# Patient Record
Sex: Male | Born: 1975 | Race: White | Hispanic: No | Marital: Single | Smoking: Current every day smoker
Health system: Southern US, Community
[De-identification: ages and names within clinical notes are randomized; demographics above are authoritative.]

## PROBLEM LIST (undated history)

## (undated) DIAGNOSIS — F32A Depression, unspecified: Secondary | ICD-10-CM

## (undated) DIAGNOSIS — F1910 Other psychoactive substance abuse, uncomplicated: Secondary | ICD-10-CM

## (undated) DIAGNOSIS — F329 Major depressive disorder, single episode, unspecified: Secondary | ICD-10-CM

## (undated) DIAGNOSIS — K219 Gastro-esophageal reflux disease without esophagitis: Secondary | ICD-10-CM

---

## 2006-11-16 ENCOUNTER — Emergency Department: Payer: Self-pay | Admitting: Unknown Physician Specialty

## 2007-04-02 ENCOUNTER — Emergency Department: Payer: Self-pay | Admitting: General Practice

## 2007-08-09 ENCOUNTER — Emergency Department: Payer: Self-pay | Admitting: Emergency Medicine

## 2008-03-21 ENCOUNTER — Emergency Department: Payer: Self-pay | Admitting: Emergency Medicine

## 2008-07-18 ENCOUNTER — Emergency Department: Payer: Self-pay | Admitting: Emergency Medicine

## 2009-08-28 ENCOUNTER — Emergency Department: Payer: Self-pay | Admitting: Internal Medicine

## 2009-09-08 ENCOUNTER — Emergency Department: Payer: Self-pay | Admitting: Emergency Medicine

## 2009-09-26 ENCOUNTER — Emergency Department: Payer: Self-pay | Admitting: Emergency Medicine

## 2010-04-25 ENCOUNTER — Emergency Department: Payer: Self-pay | Admitting: Emergency Medicine

## 2010-06-30 ENCOUNTER — Emergency Department (HOSPITAL_COMMUNITY): Admission: EM | Admit: 2010-06-30 | Discharge: 2010-06-30 | Payer: Self-pay | Admitting: Emergency Medicine

## 2011-03-05 ENCOUNTER — Emergency Department: Payer: Self-pay | Admitting: Emergency Medicine

## 2011-03-15 ENCOUNTER — Inpatient Hospital Stay: Payer: Self-pay | Admitting: Internal Medicine

## 2011-03-16 ENCOUNTER — Inpatient Hospital Stay: Payer: Self-pay | Admitting: Psychiatry

## 2011-04-16 ENCOUNTER — Emergency Department: Payer: Self-pay | Admitting: Emergency Medicine

## 2011-06-22 ENCOUNTER — Inpatient Hospital Stay: Payer: Self-pay | Admitting: Psychiatry

## 2011-08-04 ENCOUNTER — Emergency Department: Payer: Self-pay | Admitting: Emergency Medicine

## 2011-08-07 IMAGING — CR DG RIBS 2V*L*
1 series · 3 of 3 positions shown · non-contrast
Comparison: none

REASON FOR EXAM: pain
COMMENTS:

PROCEDURE:     DXR - DXR RIBS LEFT UNILATERAL  - April 25, 2010 [DATE]
RESULT:     Images of the left ribs demonstrate a marker in the
costochondral region inferiorly on the left. There is no evidence of
fracture, dislocation or radiopaque foreign body otherwise.

[Series 1: view not recorded · 0.17mm/px · 3 of 3 slices shown]
[im 1/3]
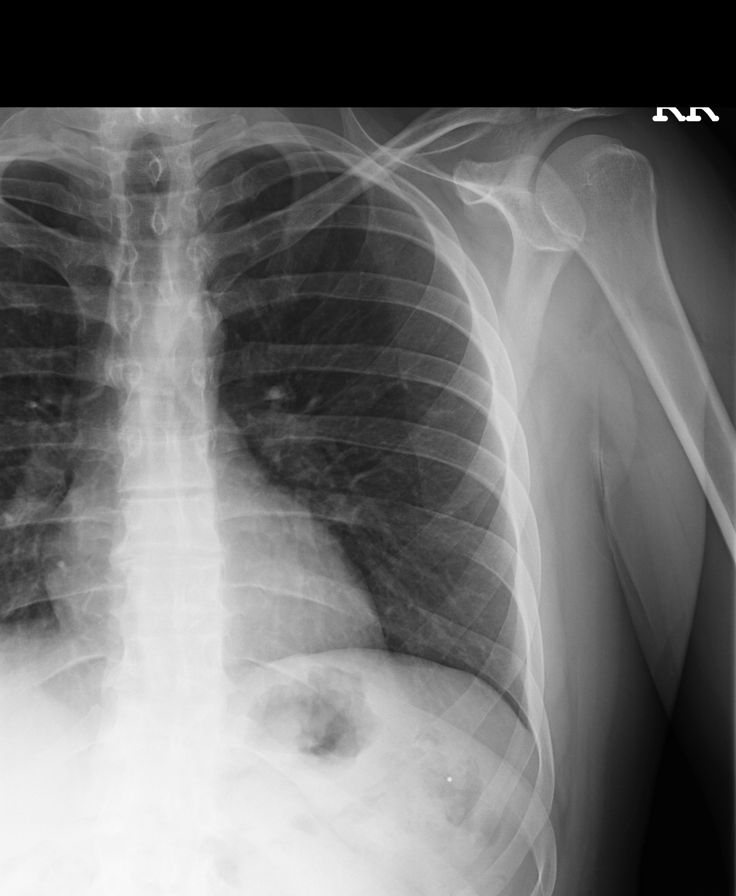
[im 2/3]
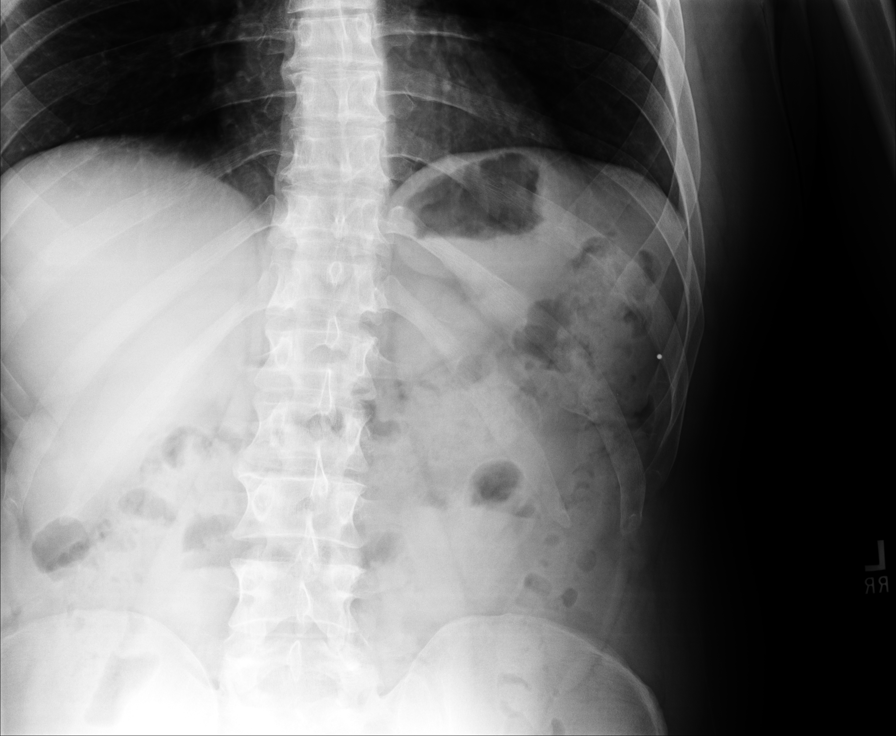
[im 3/3]
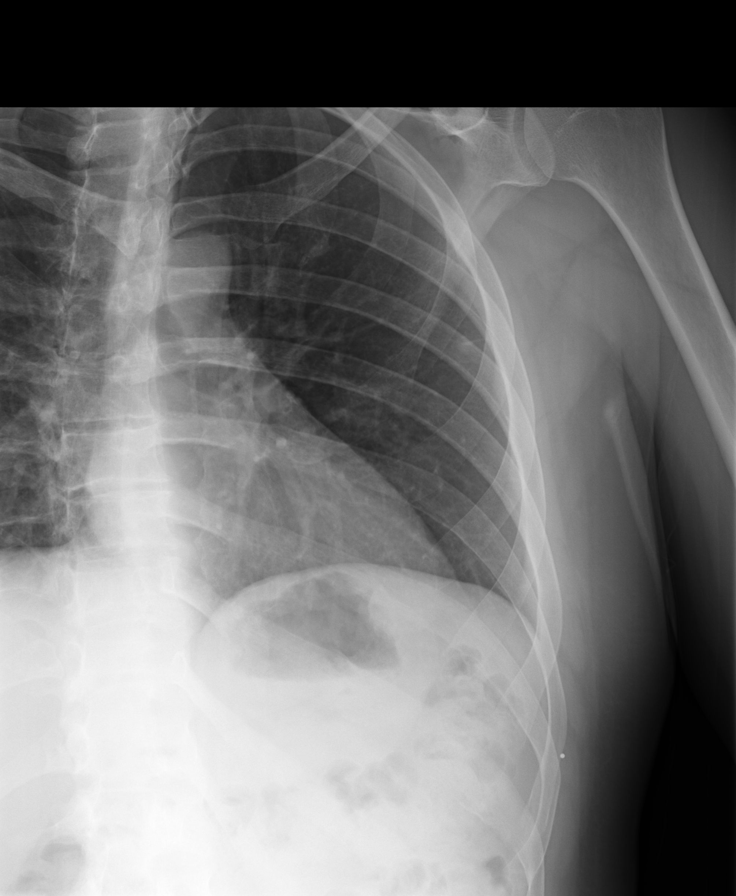

[3 of 3 positions shown; findings below may reference images not displayed]

IMPRESSION: No left rib fracture evident.

## 2011-08-07 IMAGING — CT CT CERVICAL SPINE WITHOUT CONTRAST
3 series · 16 of 33 positions shown, 19 images · non-contrast
Comparison: none

RESULT:      Multislice helical acquisition through the cervical spine is
reconstructed at bone window settings in the axial, coronal and sagittal
planes at 2 mm slice thickness and compared images from the CT dated
03/21/2008.

The previous fractures have healed. The alignment is maintained. There is no
compression deformity or subluxation. No bony encroachment on the spinal
canal is evident.

[Series 4: coronal · coronal · 0.29mm/px · 3 of 53 slices shown]
[im 11/53  bone]
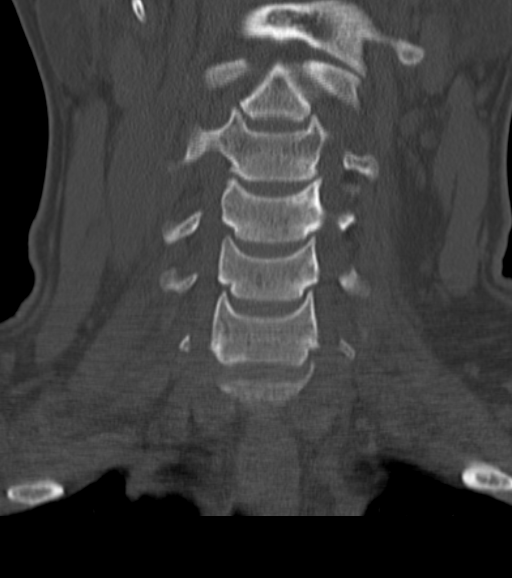
[im 21/53  bone]
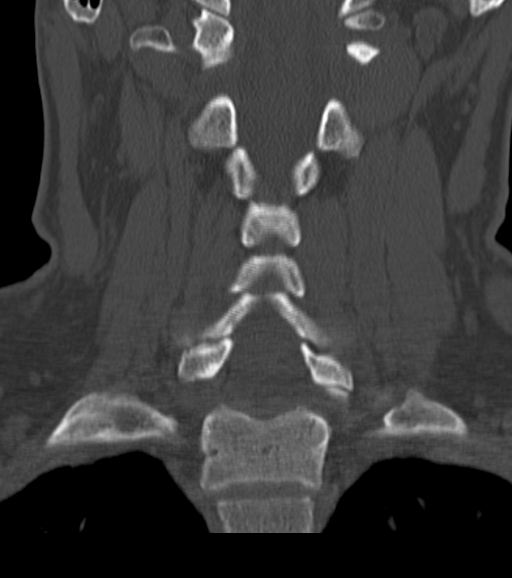
[im 32/53  bone]
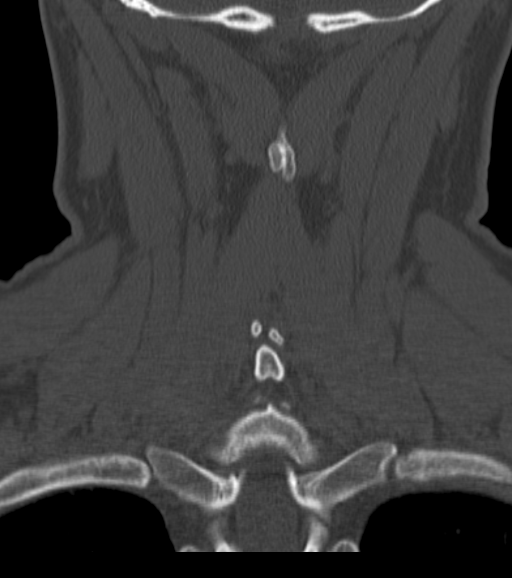

[Series 5: sagittal · sagittal · 0.32mm/px · 5 of 48 slices shown, 6 images]
[im 16/48  bone]
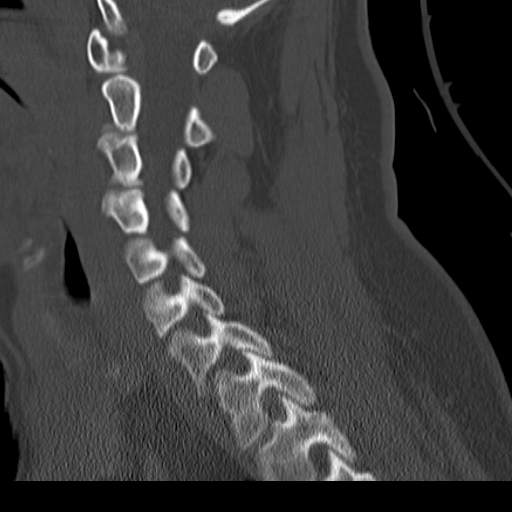
[im 20/48  bone]
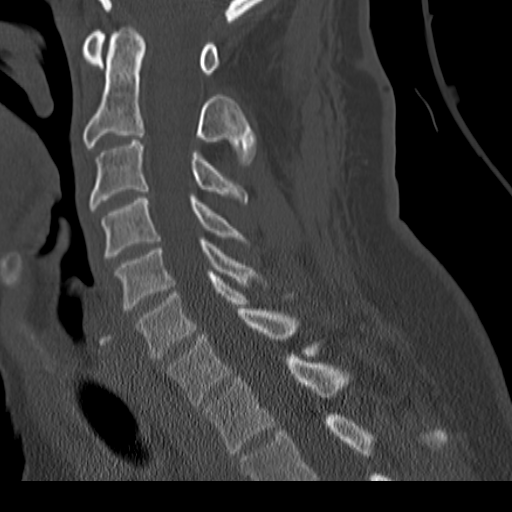
[im 24/48  soft-tissue]
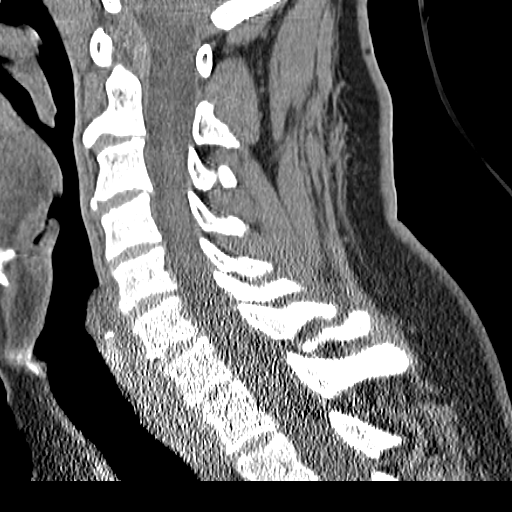
[im 24/48  bone]
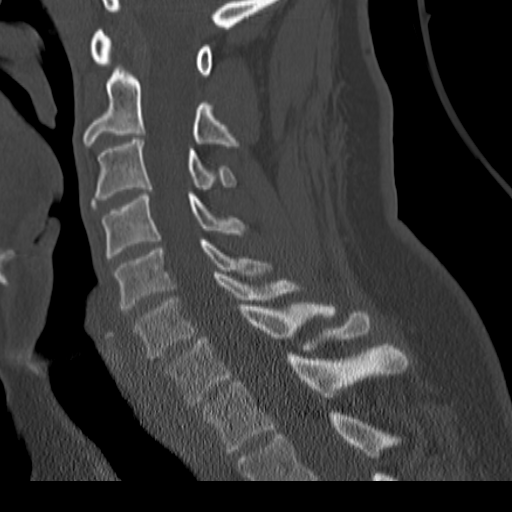
[im 28/48  bone]
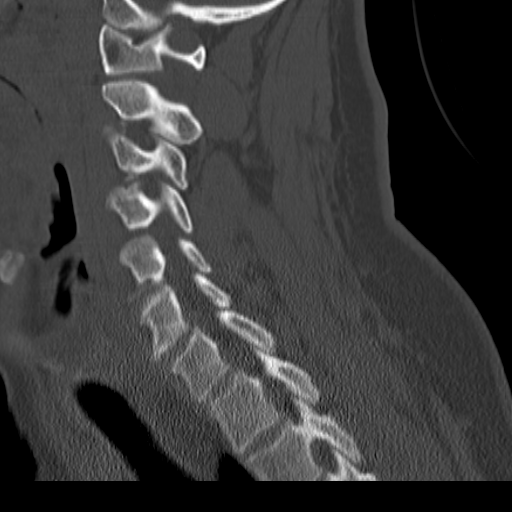
[im 32/48  bone]
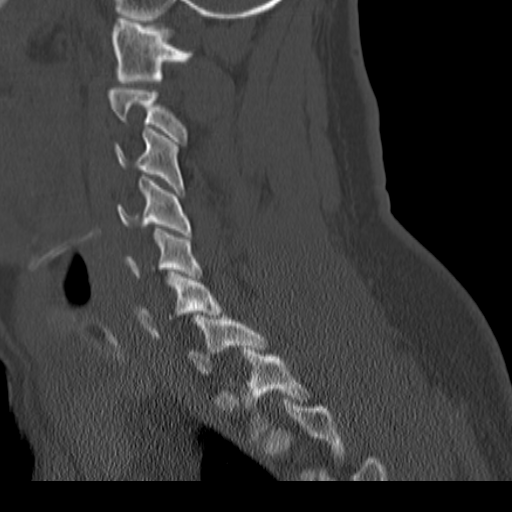

[Series 6: axial · axial · 0.33mm/px · z∈[+740,+872]mm · 8 of 78 slices shown, 10 images]
[im 6/78  soft-tissue]
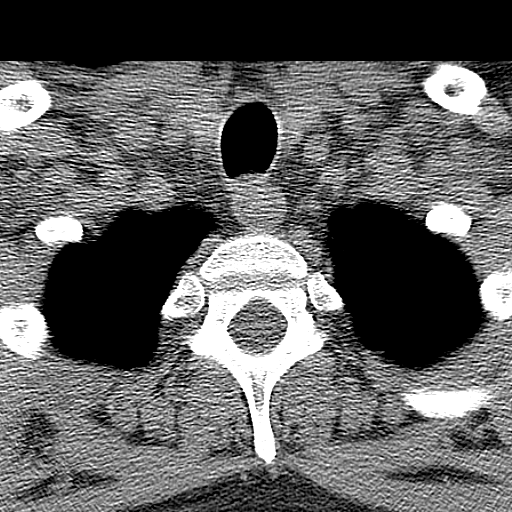
[im 6/78  bone]
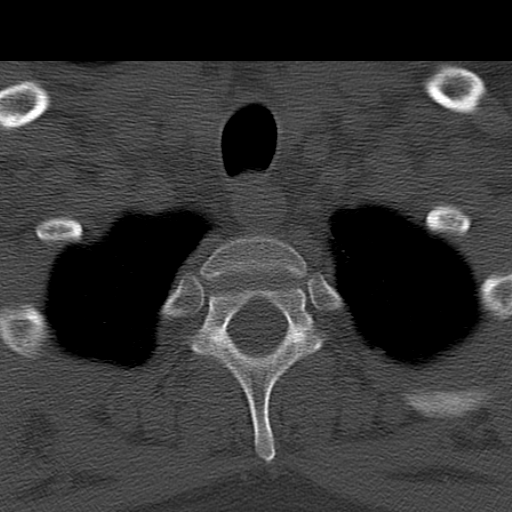
[im 18/78  bone]
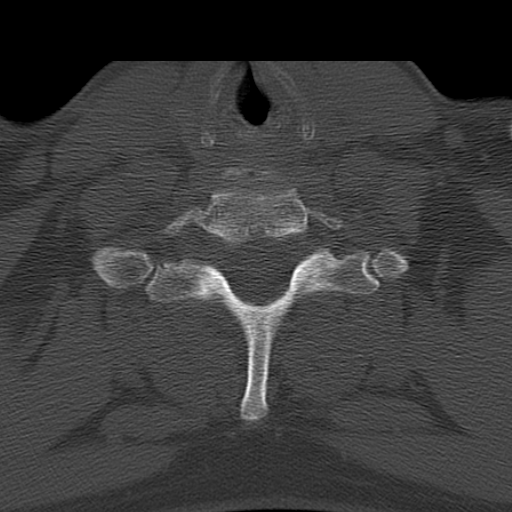
[im 24/78  bone]
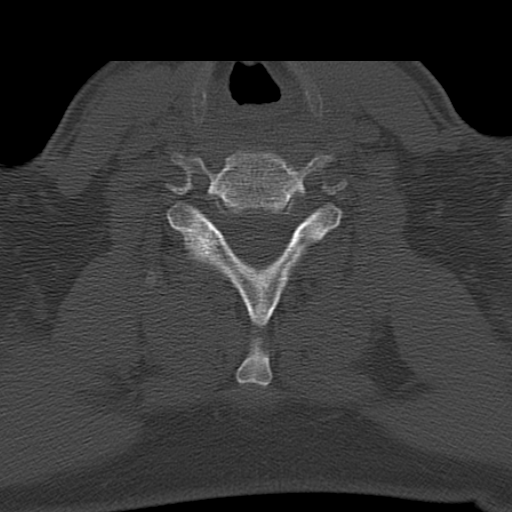
[im 36/78  bone]
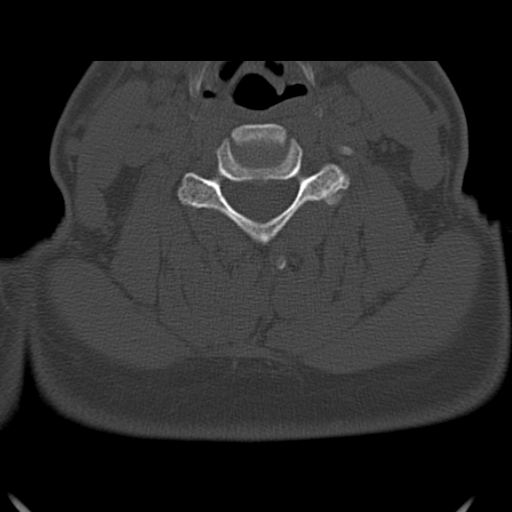
[im 42/78  soft-tissue]
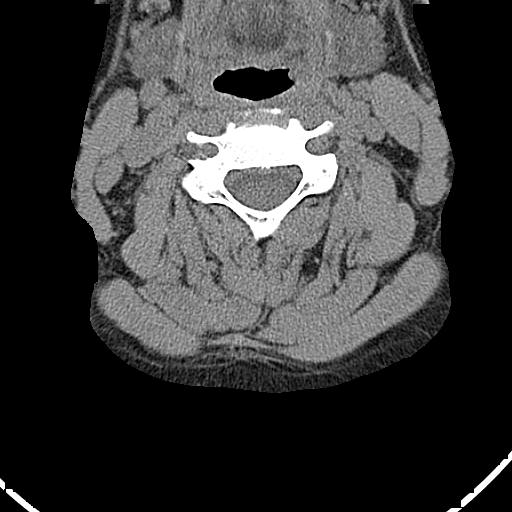
[im 42/78  bone]
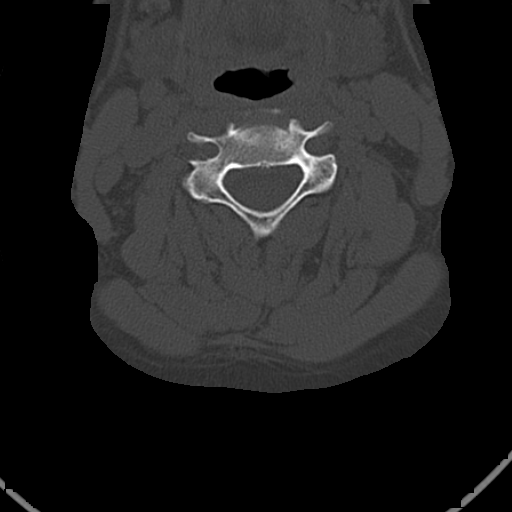
[im 54/78  bone]
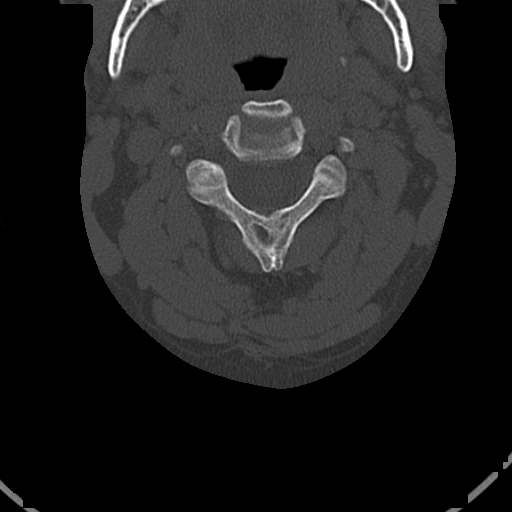
[im 60/78  bone]
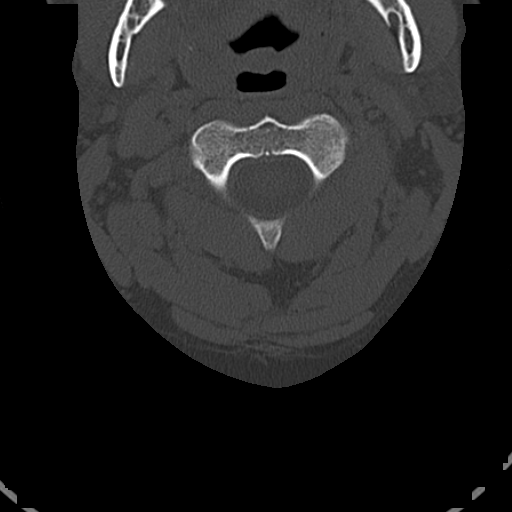
[im 72/78  bone]
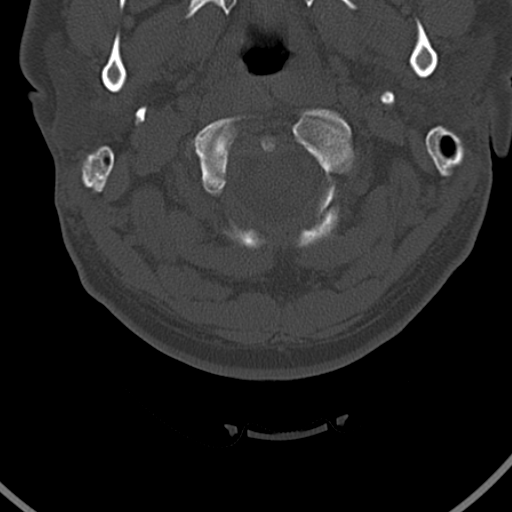

[16 of 33 positions shown; findings below may reference images not displayed]

IMPRESSION: No acute bony abnormality. Please see above.

## 2011-09-25 ENCOUNTER — Emergency Department: Payer: Self-pay | Admitting: Emergency Medicine

## 2013-09-27 ENCOUNTER — Emergency Department: Payer: Self-pay | Admitting: Emergency Medicine

## 2015-03-07 ENCOUNTER — Emergency Department
Admit: 2015-03-07 | Disposition: A | Discharge status: Home / Self Care | Payer: Self-pay | Admitting: Emergency Medicine

## 2015-08-14 ENCOUNTER — Emergency Department: Payer: Self-pay

## 2015-08-14 ENCOUNTER — Encounter: Payer: Self-pay | Admitting: Emergency Medicine

## 2015-08-14 ENCOUNTER — Emergency Department
Admission: EM | Admit: 2015-08-14 | Discharge: 2015-08-14 | Disposition: A | Discharge status: Home / Self Care | Payer: Self-pay | Attending: Emergency Medicine | Admitting: Emergency Medicine

## 2015-08-14 DIAGNOSIS — K2100 Gastro-esophageal reflux disease with esophagitis, without bleeding: Secondary | ICD-10-CM

## 2015-08-14 DIAGNOSIS — Z720 Tobacco use: Secondary | ICD-10-CM | POA: Insufficient documentation

## 2015-08-14 HISTORY — DX: Gastro-esophageal reflux disease without esophagitis: K21.9

## 2015-08-14 HISTORY — DX: Major depressive disorder, single episode, unspecified: F32.9

## 2015-08-14 HISTORY — DX: Depression, unspecified: F32.A

## 2015-08-14 LAB — BASIC METABOLIC PANEL
Anion gap: 6 (ref 5–15)
BUN: 13 mg/dL (ref 6–20)
CHLORIDE: 102 mmol/L (ref 101–111)
CO2: 27 mmol/L (ref 22–32)
CREATININE: 0.96 mg/dL (ref 0.61–1.24)
Calcium: 8.9 mg/dL (ref 8.9–10.3)
GFR calc Af Amer: >60 mL/min (ref >60)
GFR calc non Af Amer: >60 mL/min (ref >60)
GLUCOSE: 98 mg/dL (ref 65–99)
POTASSIUM: 4.4 mmol/L (ref 3.5–5.1)
Sodium: 135 mmol/L (ref 135–145)

## 2015-08-14 LAB — CBC
HEMATOCRIT: 51.3 % (ref 40.0–52.0)
Hemoglobin: 17.5 g/dL (ref 13.0–18.0)
MCH: 30.7 pg (ref 26.0–34.0)
MCHC: 34.1 g/dL (ref 32.0–36.0)
MCV: 90.1 fL (ref 80.0–100.0)
Platelets: 227 10*3/uL (ref 150–440)
RBC: 5.69 MIL/uL (ref 4.40–5.90)
RDW: 13.9 % (ref 11.5–14.5)
WBC: 5.6 10*3/uL (ref 3.8–10.6)

## 2015-08-14 LAB — TROPONIN I: Troponin I: <0.03 ng/mL (ref <0.031)

## 2015-08-14 MED ORDER — SUCRALFATE 1 G PO TABS: 1.0000 g | ORAL_TABLET | Freq: Four times a day (QID) | ORAL | Status: DC

## 2015-08-14 MED ORDER — RANITIDINE HCL 150 MG PO CAPS: 150.0000 mg | ORAL_CAPSULE | Freq: Two times a day (BID) | ORAL | Status: DC

## 2015-08-14 NOTE — Discharge Instructions (Signed)
Gastroesophageal Reflux Disease, Adult Gastroesophageal reflux disease (GERD) happens when acid from your stomach flows up into the esophagus. When acid comes in contact with the esophagus, the acid causes soreness (inflammation) in the esophagus. Over time, GERD may create small holes (ulcers) in the lining of the esophagus. CAUSES   Increased body weight. This puts pressure on the stomach, making acid rise from the stomach into the esophagus.  Smoking. This increases acid production in the stomach.  Drinking alcohol. This causes decreased pressure in the lower esophageal sphincter (valve or ring of muscle between the esophagus and stomach), allowing acid from the stomach into the esophagus.  Late evening meals and a full stomach. This increases pressure and acid production in the stomach.  A malformed lower esophageal sphincter. Sometimes, no cause is found. SYMPTOMS   Burning pain in the lower part of the mid-chest behind the breastbone and in the mid-stomach area. This may occur twice a week or more often.  Trouble swallowing.  Sore throat.  Dry cough.  Asthma-like symptoms including chest tightness, shortness of breath, or wheezing. DIAGNOSIS  Your caregiver may be able to diagnose GERD based on your symptoms. In some cases, X-rays and other tests may be done to check for complications or to check the condition of your stomach and esophagus. TREATMENT  Your caregiver may recommend over-the-counter or prescription medicines to help decrease acid production. Ask your caregiver before starting or adding any new medicines.  HOME CARE INSTRUCTIONS   Change the factors that you can control. Ask your caregiver for guidance concerning weight loss, quitting smoking, and alcohol consumption.  Avoid foods and drinks that make your symptoms worse, such as:  Caffeine or alcoholic drinks.  Chocolate.  Peppermint or mint flavorings.  Garlic and onions.  Spicy foods.  Citrus fruits,  such as oranges, lemons, or limes.  Tomato-based foods such as sauce, chili, salsa, and pizza.  Fried and fatty foods.  Avoid lying down for the 3 hours prior to your bedtime or prior to taking a nap.  Eat small, frequent meals instead of large meals.  Wear loose-fitting clothing. Do not wear anything tight around your waist that causes pressure on your stomach.  Raise the head of your bed 6 to 8 inches with wood blocks to help you sleep. Extra pillows will not help.  Only take over-the-counter or prescription medicines for pain, discomfort, or fever as directed by your caregiver.  Do not take aspirin, ibuprofen, or other nonsteroidal anti-inflammatory drugs (NSAIDs). SEEK IMMEDIATE MEDICAL CARE IF:   You have pain in your arms, neck, jaw, teeth, or back.  Your pain increases or changes in intensity or duration.  You develop nausea, vomiting, or sweating (diaphoresis).  You develop shortness of breath, or you faint.  Your vomit is green, yellow, black, or looks like coffee grounds or blood.  Your stool is red, bloody, or black. These symptoms could be signs of other problems, such as heart disease, gastric bleeding, or esophageal bleeding. MAKE SURE YOU:   Understand these instructions.  Will watch your condition.  Will get help right away if you are not doing well or get worse. Document Released: 08/25/2005 Document Revised: 02/07/2012 Document Reviewed: 06/04/2011 [employer] Patient Information 2015 [employer]. This information is not intended to replace advice given to you by your health care provider. Make sure you discuss any questions you have with your health care provider.  

## 2015-08-14 NOTE — ED Notes (Signed)
Pt to ed with c/o central chest pain that started about 3 months ago.  Pt denies seeing PMD for same.  Pt also reports last 3 days increasingly more severe.  Pt reports cramping and tightness in chest with nausea.

## 2015-08-14 NOTE — ED Notes (Signed)
Pt states upper abd spasms for a few weeks, states worse when lying down at night and worse with eating, states he takes omperzole 40 mg daily, denies any nausea or vomiting

## 2015-08-14 NOTE — ED Provider Notes (Signed)
[facility] Emergency Department Provider Note  ____________________________________________  Time seen: 1:55 PM  I have reviewed the triage vital signs and the nursing notes.   HISTORY  Chief Complaint Chest Pain    HPI William Gonzales is a 39 y.o. male who complains of upper abdominal pain radiating into the chest that is a burning and tightness associated with nausea. This been going on for the last 3 months ago increasingly more severe in the last 3 days. Not exertional, not pleuritic. No associated vomiting shortness of breath or diaphoresis. He does have a 20 year smoking history and does continue to smoke. Denies any coughing or fevers.  Takes omeprazole daily for GERD. Has an appointment with his primary care doctor, Dr. Elbert Ewings in one week.   Past Medical History  Diagnosis Date  . GERD (gastroesophageal reflux disease)   . Depression      There are no active problems to display for this patient.    History reviewed. No pertinent past surgical history.   Current Outpatient Rx  Name  Route  Sig  Dispense  Refill  . ranitidine (ZANTAC) 150 MG capsule   Oral   Take 1 capsule (150 mg total) by mouth 2 (two) times daily.   28 capsule   0   . sucralfate (CARAFATE) 1 G tablet   Oral   Take 1 tablet (1 g total) by mouth 4 (four) times daily.   120 tablet   1      Allergies Review of patient's allergies indicates no known allergies.   History reviewed. No pertinent family history.  Social History Social History  Substance Use Topics  . Smoking status: Current Every Day Smoker  . Smokeless tobacco: None  . Alcohol Use: Yes    Review of Systems  Constitutional:   No fever or chills. No weight changes Eyes:   No blurry vision or double vision.  ENT:   No sore throat. Cardiovascular:   Positive as above chest pain and upper abdominal pain. Respiratory:   No dyspnea or cough. Gastrointestinal:   Negative for abdominal pain, vomiting  and diarrhea.  No BRBPR or melena. Genitourinary:   Negative for dysuria, urinary retention, bloody urine, or difficulty urinating. Musculoskeletal:   Negative for back pain. No joint swelling or pain. Skin:   Negative for rash. Neurological:   Negative for headaches, focal weakness or numbness. Psychiatric:  No anxiety or depression.   Endocrine:  No hot/cold intolerance, changes in energy, or sleep difficulty.  10-point ROS otherwise negative.  ____________________________________________   PHYSICAL EXAM:  VITAL SIGNS: ED Triage Vitals  Enc Vitals Group     BP 08/14/15 1204 138/86 mmHg     Pulse Rate 08/14/15 1204 79     Resp 08/14/15 1204 20     Temp 08/14/15 1204 98.2 F (36.8 C)     Temp Source 08/14/15 1204 Oral     SpO2 08/14/15 1204 100 %     Weight 08/14/15 1204 190 lb (86.183 kg)     Height 08/14/15 1204 5\' 8"  (1.727 m)     Head Cir --      Peak Flow --      Pain Score 08/14/15 1205 6     Pain Loc --      Pain Edu? --      Excl. in GC? --      Constitutional:   Alert and oriented. Well appearing and in no distress. Eyes:   No scleral  icterus. No conjunctival pallor. PERRL. EOMI ENT   Head:   Normocephalic and atraumatic.   Nose:   No congestion/rhinnorhea. No septal hematoma   Mouth/Throat:   MMM, no pharyngeal erythema. No peritonsillar mass. No uvula shift.   Neck:   No stridor. No SubQ emphysema. No meningismus. Hematological/Lymphatic/Immunilogical:   No cervical lymphadenopathy. Cardiovascular:   RRR. Normal and symmetric distal pulses are present in all extremities. No murmurs, rubs, or gallops. Respiratory:   Normal respiratory effort without tachypnea nor retractions. Breath sounds are clear and equal bilaterally. No wheezes/rales/rhonchi. Gastrointestinal:   Soft with mild epigastric tenderness. No distention. There is no CVA tenderness.  No rebound, rigidity, or guarding. Genitourinary:   deferred Musculoskeletal:   Nontender with  normal range of motion in all extremities. No joint effusions.  No lower extremity tenderness.  No edema. Neurologic:   Normal speech and language.  CN 2-10 normal. Motor grossly intact. No pronator drift.  Normal gait. No gross focal neurologic deficits are appreciated.  Skin:    Skin is warm, dry and intact. No rash noted.  No petechiae, purpura, or bullae. Psychiatric:   Mood and affect are normal. Speech and behavior are normal. Patient exhibits appropriate insight and judgment.  ____________________________________________    LABS (pertinent positives/negatives) (all labs ordered are listed, but only abnormal results are displayed) Labs Reviewed  BASIC METABOLIC PANEL  CBC  TROPONIN I   ____________________________________________   EKG  Interpreted by me Normal sinus rhythm rate of 80, normal axis intervals QRS and ST segments and T waves. There is one PAC on the strip.  ____________________________________________    RADIOLOGY  Chest x-ray unremarkable  ____________________________________________   PROCEDURES   ____________________________________________   INITIAL IMPRESSION / ASSESSMENT AND PLAN / ED COURSE  Pertinent labs & imaging results that were available during my care of the patient were reviewed by me and considered in my medical decision making (see chart for details).  Labs EKG and chest x-ray unremarkable. History not concerning for ACS PE TAD Pneumovax carditis many sinus pneumonia or sepsis. It is consistent with his history of GERD especially with ongoing smoking. I will put him on Sudafed and Zantac for 1 week trial have him follow up with primary care who can discuss referral to gastroenterology if needed. Low suspicion for AAA biliary disease or pancreatitis.     ____________________________________________   FINAL CLINICAL IMPRESSION(S) / ED DIAGNOSES  Final diagnoses:  Gastroesophageal reflux disease with esophagitis       Sharman Cheek, MD 08/14/15 1445

## 2016-08-07 ENCOUNTER — Encounter (HOSPITAL_COMMUNITY): Payer: Self-pay | Admitting: Emergency Medicine

## 2016-08-07 ENCOUNTER — Emergency Department (HOSPITAL_COMMUNITY)
Admission: EM | Admit: 2016-08-07 | Discharge: 2016-08-07 | Disposition: A | Discharge status: Home / Self Care | Payer: Self-pay | Attending: Emergency Medicine | Admitting: Emergency Medicine

## 2016-08-07 DIAGNOSIS — T40601A Poisoning by unspecified narcotics, accidental (unintentional), initial encounter: Secondary | ICD-10-CM | POA: Insufficient documentation

## 2016-08-07 DIAGNOSIS — F17200 Nicotine dependence, unspecified, uncomplicated: Secondary | ICD-10-CM | POA: Insufficient documentation

## 2016-08-07 DIAGNOSIS — Z79899 Other long term (current) drug therapy: Secondary | ICD-10-CM | POA: Insufficient documentation

## 2016-08-07 MED ORDER — NICOTINE 21 MG/24HR TD PT24
21.0000 mg | MEDICATED_PATCH | Freq: Every day | TRANSDERMAL | Status: DC
  Administered 2016-08-07: 21 mg via TRANSDERMAL
  Filled 2016-08-07: qty 1

## 2016-08-07 NOTE — ED Triage Notes (Signed)
Per EMS, pt opiate overdose, given 4mg  Narcan. Pt reports snorting heroin. Pt also reports chronic knee and neck pain.

## 2016-08-07 NOTE — ED Notes (Signed)
Bed: [id number] Expected date:  Expected time:  Means of arrival:  Comments: OD

## 2016-08-07 NOTE — ED Notes (Signed)
MD at bedside. 

## 2016-08-07 NOTE — ED Provider Notes (Signed)
[facility]-EMERGENCY DEPT Provider Note   CSN: [id number] Arrival date & time: 08/07/16  1703     History   Chief Complaint Chief Complaint  Patient presents with  . Drug Overdose    HPI William PrimeRobert D Lipe is a 40 y.o. male.  He presents by EMS, with history that he required Narcan to arouse. He does not remember what happened today. He complains of pain in center of his chest and nasal congestion. Is not clear if the patient received CPR or not. He states that he has been snorting heroin. He complains of chronic pain in neck, back, arms and legs. He denies fever, chills, nausea or vomiting. There are no other known modifying factors.  HPI  Past Medical History:  Diagnosis Date  . Depression   . GERD (gastroesophageal reflux disease)     There are no active problems to display for this patient.   History reviewed. No pertinent surgical history.     Home Medications    Prior to Admission medications   Medication Sig Start Date End Date Taking? Authorizing Provider  ranitidine (ZANTAC) 150 MG capsule Take 1 capsule (150 mg total) by mouth 2 (two) times daily. 08/14/15   Sharman CheekPhillip Stafford, MD  sucralfate (CARAFATE) 1 G tablet Take 1 tablet (1 g total) by mouth 4 (four) times daily. 08/14/15   Sharman CheekPhillip Stafford, MD    Family History History reviewed. No pertinent family history.  Social History Social History  Substance Use Topics  . Smoking status: Current Every Day Smoker  . Smokeless tobacco: Current User  . Alcohol use Yes     Allergies   Review of patient's allergies indicates no known allergies.   Review of Systems Review of Systems  All other systems reviewed and are negative.    Physical Exam Updated Vital Signs BP 147/94 (BP Location: Right Arm)   Pulse 97   Temp 98.9 F (37.2 C) (Oral)   Resp 16   SpO2 96%   Physical Exam  Constitutional: He is oriented to person, place, and time. He appears well-developed and well-nourished.  HENT:  Head:  Normocephalic and atraumatic.  Right Ear: External ear normal.  Left Ear: External ear normal.  Nasal congestion, consistent with recent administration of Narcan  Eyes: Conjunctivae and EOM are normal. Pupils are equal, round, and reactive to light.  Neck: Normal range of motion and phonation normal. Neck supple.  Cardiovascular: Normal rate.   Pulmonary/Chest: Effort normal. He exhibits no bony tenderness.  Musculoskeletal: Normal range of motion.  Neurological: He is alert and oriented to person, place, and time. No cranial nerve deficit or sensory deficit. He exhibits normal muscle tone. Coordination normal.  Skin: Skin is warm, dry and intact.  Psychiatric: He has a normal mood and affect. His behavior is normal. Judgment and thought content normal.  Nursing note and vitals reviewed.    ED Treatments / Results  Labs (all labs ordered are listed, but only abnormal results are displayed) Labs Reviewed - No data to display  EKG  EKG Interpretation None       Radiology No results found.  Procedures Procedures (including critical care time)  Medications Ordered in ED Medications  nicotine (NICODERM CQ - dosed in mg/24 hours) patch 21 mg (21 mg Transdermal Patch Applied 08/07/16 1737)     Initial Impression / Assessment and Plan / ED Course  I have reviewed the triage vital signs and the nursing notes.  Pertinent labs & imaging results that were available during  my care of the patient were reviewed by me and considered in my medical decision making (see chart for details).  Clinical Course    Medications  nicotine (NICODERM CQ - dosed in mg/24 hours) patch 21 mg (21 mg Transdermal Patch Applied 08/07/16 1737)    Patient Vitals for the past 24 hrs:  BP Temp Temp src Pulse Resp SpO2  08/07/16 1758 147/94 - - 97 16 96 %  08/07/16 1713 - - - - - 95 %  08/07/16 1711 134/95 98.9 F (37.2 C) Oral 98 20 98 %    5:55 PM Reevaluation with update and discussion. After  initial assessment and treatment, an updated evaluation reveals He is alert, lucid, and desires date at this time. He has a family member here with him. Findings discussed with patient. Arlen Legendre L    Final Clinical Impressions(s) / ED Diagnoses   Final diagnoses:  Narcotic overdose, accidental or unintentional, initial encounter    Recreational  heroin use, with overdose, treated with Narcan successfully. Chronic pain related to prior orthopedic problems.   Nursing Notes Reviewed/ Care Coordinated Applicable Imaging Reviewed Interpretation of Laboratory Data incorporated into ED treatment  The patient appears reasonably screened and/or stabilized for discharge and I doubt any other medical condition or other [facility] requiring further screening, evaluation, or treatment in the ED at this time prior to discharge.  Plan: Home Medications- continue; Home Treatments- rest; return here if the recommended treatment, does not improve the symptoms; Recommended follow up- PCP prn    New Prescriptions New Prescriptions   No medications on file     Mancel Bale, MD 08/07/16 1801

## 2017-01-26 ENCOUNTER — Emergency Department
Admission: EM | Admit: 2017-01-26 | Discharge: 2017-01-26 | Disposition: A | Discharge status: Other Acute Inpatient Hospital | Payer: Self-pay | Attending: Emergency Medicine | Admitting: Emergency Medicine

## 2017-01-26 ENCOUNTER — Inpatient Hospital Stay
Admission: RE | Admit: 2017-01-26 | Discharge: 2017-01-28 | DRG: 897 | Disposition: A | Discharge status: Home / Self Care | Payer: No Typology Code available for payment source | Source: Ambulatory Visit | Attending: Psychiatry | Admitting: Psychiatry

## 2017-01-26 DIAGNOSIS — K219 Gastro-esophageal reflux disease without esophagitis: Secondary | ICD-10-CM

## 2017-01-26 DIAGNOSIS — F17210 Nicotine dependence, cigarettes, uncomplicated: Secondary | ICD-10-CM | POA: Diagnosis present

## 2017-01-26 DIAGNOSIS — Z5181 Encounter for therapeutic drug level monitoring: Secondary | ICD-10-CM | POA: Insufficient documentation

## 2017-01-26 DIAGNOSIS — F15250 Other stimulant dependence with stimulant-induced psychotic disorder with delusions: Secondary | ICD-10-CM | POA: Diagnosis present

## 2017-01-26 DIAGNOSIS — G4700 Insomnia, unspecified: Secondary | ICD-10-CM | POA: Diagnosis present

## 2017-01-26 DIAGNOSIS — F1120 Opioid dependence, uncomplicated: Secondary | ICD-10-CM

## 2017-01-26 DIAGNOSIS — Z79899 Other long term (current) drug therapy: Secondary | ICD-10-CM

## 2017-01-26 DIAGNOSIS — F333 Major depressive disorder, recurrent, severe with psychotic symptoms: Secondary | ICD-10-CM | POA: Diagnosis present

## 2017-01-26 DIAGNOSIS — F29 Unspecified psychosis not due to a substance or known physiological condition: Secondary | ICD-10-CM | POA: Diagnosis present

## 2017-01-26 DIAGNOSIS — F1520 Other stimulant dependence, uncomplicated: Secondary | ICD-10-CM

## 2017-01-26 DIAGNOSIS — F17200 Nicotine dependence, unspecified, uncomplicated: Secondary | ICD-10-CM | POA: Diagnosis present

## 2017-01-26 DIAGNOSIS — F15950 Other stimulant use, unspecified with stimulant-induced psychotic disorder with delusions: Secondary | ICD-10-CM | POA: Diagnosis present

## 2017-01-26 DIAGNOSIS — F3110 Bipolar disorder, current episode manic without psychotic features, unspecified: Secondary | ICD-10-CM | POA: Insufficient documentation

## 2017-01-26 LAB — COMPREHENSIVE METABOLIC PANEL
ALBUMIN: 4.8 g/dL (ref 3.5–5.0)
ALT: 70 U/L — ABNORMAL HIGH (ref 17–63)
ANION GAP: 12 (ref 5–15)
AST: 44 U/L — AB (ref 15–41)
Alkaline Phosphatase: 67 U/L (ref 38–126)
BUN: 22 mg/dL — AB (ref 6–20)
CHLORIDE: 95 mmol/L — AB (ref 101–111)
CO2: 24 mmol/L (ref 22–32)
Calcium: 9.8 mg/dL (ref 8.9–10.3)
Creatinine, Ser: 1.29 mg/dL — ABNORMAL HIGH (ref 0.61–1.24)
GFR calc Af Amer: >60 mL/min (ref >60)
GLUCOSE: 110 mg/dL — AB (ref 65–99)
POTASSIUM: 3.5 mmol/L (ref 3.5–5.1)
Sodium: 131 mmol/L — ABNORMAL LOW (ref 135–145)
Total Bilirubin: 2.6 mg/dL — ABNORMAL HIGH (ref 0.3–1.2)
Total Protein: 8.1 g/dL (ref 6.5–8.1)

## 2017-01-26 LAB — CBC
HCT: 48.6 % (ref 40.0–52.0)
Hemoglobin: 17 g/dL (ref 13.0–18.0)
MCH: 30.5 pg (ref 26.0–34.0)
MCHC: 35 g/dL (ref 32.0–36.0)
MCV: 87.2 fL (ref 80.0–100.0)
PLATELETS: 244 10*3/uL (ref 150–440)
RBC: 5.58 MIL/uL (ref 4.40–5.90)
RDW: 14 % (ref 11.5–14.5)
WBC: 12.3 10*3/uL — ABNORMAL HIGH (ref 3.8–10.6)

## 2017-01-26 LAB — URINE DRUG SCREEN, QUALITATIVE (ARMC ONLY)
Amphetamines, Ur Screen: POSITIVE — AB
BARBITURATES, UR SCREEN: NOT DETECTED
BENZODIAZEPINE, UR SCRN: NOT DETECTED
Cannabinoid 50 Ng, Ur ~~LOC~~: NOT DETECTED
Cocaine Metabolite,Ur ~~LOC~~: NOT DETECTED
MDMA (Ecstasy)Ur Screen: NOT DETECTED
METHADONE SCREEN, URINE: NOT DETECTED
Opiate, Ur Screen: POSITIVE — AB
Phencyclidine (PCP) Ur S: NOT DETECTED
TRICYCLIC, UR SCREEN: NOT DETECTED

## 2017-01-26 LAB — ETHANOL

## 2017-01-26 MED ORDER — DIAZEPAM 5 MG PO TABS
10.0000 mg | ORAL_TABLET | Freq: Once | ORAL | Status: AC
  Administered 2017-01-26: 10 mg via ORAL
  Filled 2017-01-26: qty 2

## 2017-01-26 MED ORDER — ACETAMINOPHEN 325 MG PO TABS
650.0000 mg | ORAL_TABLET | Freq: Four times a day (QID) | ORAL | Status: DC | PRN
Start: 2017-01-26 — End: 2017-01-28

## 2017-01-26 MED ORDER — RISPERIDONE 1 MG PO TABS
1.0000 mg | ORAL_TABLET | Freq: Two times a day (BID) | ORAL | Status: DC
  Administered 2017-01-26: 1 mg via ORAL
  Filled 2017-01-26: qty 1

## 2017-01-26 MED ORDER — NICOTINE 21 MG/24HR TD PT24
21.0000 mg | MEDICATED_PATCH | Freq: Every day | TRANSDERMAL | Status: DC
  Administered 2017-01-27 – 2017-01-28 (×2): 21 mg via TRANSDERMAL
  Filled 2017-01-26 (×3): qty 1

## 2017-01-26 MED ORDER — NICOTINE 21 MG/24HR TD PT24
21.0000 mg | MEDICATED_PATCH | Freq: Once | TRANSDERMAL | Status: DC
Start: 2017-01-26 — End: 2017-01-26
  Administered 2017-01-26: 21 mg via TRANSDERMAL
  Filled 2017-01-26: qty 1

## 2017-01-26 MED ORDER — ALUM & MAG HYDROXIDE-SIMETH 200-200-20 MG/5ML PO SUSP: 30.0000 mL | ORAL | Status: DC | PRN

## 2017-01-26 MED ORDER — RISPERIDONE 1 MG PO TABS
1.0000 mg | ORAL_TABLET | Freq: Two times a day (BID) | ORAL | Status: DC
  Administered 2017-01-26 – 2017-01-28 (×4): 1 mg via ORAL
  Filled 2017-01-26 (×4): qty 1

## 2017-01-26 MED ORDER — MAGNESIUM HYDROXIDE 400 MG/5ML PO SUSP: 30.0000 mL | Freq: Every day | ORAL | Status: DC | PRN

## 2017-01-26 MED ORDER — PANTOPRAZOLE SODIUM 40 MG PO TBEC
40.0000 mg | DELAYED_RELEASE_TABLET | Freq: Every day | ORAL | Status: DC
Start: 2017-01-27 — End: 2017-01-28
  Administered 2017-01-27 – 2017-01-28 (×2): 40 mg via ORAL
  Filled 2017-01-26 (×2): qty 1

## 2017-01-26 MED ORDER — PANTOPRAZOLE SODIUM 40 MG PO TBEC
40.0000 mg | DELAYED_RELEASE_TABLET | Freq: Every day | ORAL | Status: DC
  Administered 2017-01-26: 40 mg via ORAL
  Filled 2017-01-26: qty 1

## 2017-01-26 MED ORDER — TRAZODONE HCL 100 MG PO TABS
100.0000 mg | ORAL_TABLET | Freq: Every evening | ORAL | Status: DC | PRN
  Administered 2017-01-26: 100 mg via ORAL
  Filled 2017-01-26: qty 1

## 2017-01-26 NOTE — ED Notes (Signed)
Pt agitated and asking for something for anxiety.

## 2017-01-26 NOTE — ED Notes (Signed)
Pt stated he run out of his house so quick this morning he couldn't take a shower. Pt is taking a shower and informed of the 10 min rule.

## 2017-01-26 NOTE — ED Notes (Signed)
[facility]. Toni Amendlapacs.  Attending Physician will be Dr. Ardyth HarpsHernandez.   Patient [facility] to room 313, [facility] Nurse StronachPhyllis.   Intake Paper Work has been signed and placed on patient chart.  ER staff is aware of the admission (  ER Sect.;ER MD; Toniann FailWendy Patient's Nurse & Jonny RuizJohn Patient Access).
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
## 2017-01-26 NOTE — ED Notes (Signed)
Pt given more water and encouraged to get us a urine sample.
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
## 2017-01-26 NOTE — Progress Notes (Signed)
Patient calm and cooperative with admission interview and assessment. No SI/HI/AVH at this time. Patient VS monitored and recorded. Skin check with no wounds or bruises. Skin check with no contraband. Brief orientation to plan of care. Safety maintained. Unable to apply Nicotine patch, patient sleeping soundly at this time.
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
## 2017-01-26 NOTE — ED Notes (Signed)
Patient transferred to lower level (BMU) accompanied by police and staff with all belongings and IVC papers
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
## 2017-01-26 NOTE — ED Notes (Signed)
Pt returned to bed. Water cleaned from bathroom floor.

## 2017-01-26 NOTE — ED Notes (Signed)
Dr. Clapacs at bedside assessing patient. 

## 2017-01-26 NOTE — ED Notes (Signed)
[facility]

## 2017-01-26 NOTE — ED Notes (Signed)
Pt given lunch tray and water  

## 2017-01-26 NOTE — Tx Team (Signed)
Initial Treatment Plan 01/26/2017 4:27 PM William PrimeRobert D Gonzales [id number]    PATIENT STRESSORS: Medication change or noncompliance Substance abuse   PATIENT STRENGTHS: Capable of independent living Physical Health   PATIENT IDENTIFIED PROBLEMS: Substance abuse    Drug induced psychosis                  DISCHARGE CRITERIA:  Ability to meet basic life and health needs Improved stabilization in mood, thinking, and/or behavior Motivation to continue treatment in a less acute level of care  PRELIMINARY DISCHARGE PLAN: Attend aftercare/continuing care group Return to previous living arrangement  PATIENT/FAMILY INVOLVEMENT: This treatment plan has been presented to and reviewed with the patient, William Gonzales, and/or family member, .  The patient and family have been given the opportunity to ask questions and make suggestions.  William FellingJennifer A Halli Equihua, RN 01/26/2017, 4:27 PM

## 2017-01-26 NOTE — Plan of Care (Signed)
Problem: Pain Managment: Goal: General experience of comfort will improve Outcome: Not Met (add Reason) No c/o pain/discomfort  Comments: Patient remains anxious. Trazodone 100 mg po given PRN for sleep. No behavior issues noted. No voiced thoughts of hurting himself. q 15 min checks maintained for safety.0

## 2017-01-26 NOTE — ED Notes (Signed)
Pt given blanket and offered breakfast tray which he refused.

## 2017-01-26 NOTE — ED Notes (Signed)
Report given to Elenore PaddyJennifer Morrow, RN-

## 2017-01-27 DIAGNOSIS — F333 Major depressive disorder, recurrent, severe with psychotic symptoms: Secondary | ICD-10-CM | POA: Diagnosis present

## 2017-01-27 DIAGNOSIS — F15950 Other stimulant use, unspecified with stimulant-induced psychotic disorder with delusions: Secondary | ICD-10-CM

## 2017-01-27 LAB — LIPID PANEL
CHOL/HDL RATIO: 3.2 ratio
CHOLESTEROL: 108 mg/dL (ref 0–200)
HDL: 34 mg/dL — ABNORMAL LOW (ref >40)
LDL Cholesterol: 60 mg/dL (ref 0–99)
Triglycerides: 71 mg/dL (ref <150)
VLDL: 14 mg/dL (ref 0–40)

## 2017-01-27 LAB — COMPREHENSIVE METABOLIC PANEL
ALBUMIN: 4 g/dL (ref 3.5–5.0)
ALK PHOS: 73 U/L (ref 38–126)
ALT: 61 U/L (ref 17–63)
ANION GAP: 7 (ref 5–15)
AST: 47 U/L — AB (ref 15–41)
BILIRUBIN TOTAL: 1.9 mg/dL — AB (ref 0.3–1.2)
BUN: 16 mg/dL (ref 6–20)
CALCIUM: 8.8 mg/dL — AB (ref 8.9–10.3)
CO2: 29 mmol/L (ref 22–32)
Chloride: 98 mmol/L — ABNORMAL LOW (ref 101–111)
Creatinine, Ser: 1.22 mg/dL (ref 0.61–1.24)
GFR calc Af Amer: >60 mL/min (ref >60)
GLUCOSE: 101 mg/dL — AB (ref 65–99)
POTASSIUM: 3.5 mmol/L (ref 3.5–5.1)
Sodium: 134 mmol/L — ABNORMAL LOW (ref 135–145)
TOTAL PROTEIN: 7.1 g/dL (ref 6.5–8.1)

## 2017-01-27 LAB — CBC
HEMATOCRIT: 47.6 % (ref 40.0–52.0)
Hemoglobin: 16.4 g/dL (ref 13.0–18.0)
MCH: 30.4 pg (ref 26.0–34.0)
MCHC: 34.4 g/dL (ref 32.0–36.0)
MCV: 88.5 fL (ref 80.0–100.0)
Platelets: 205 10*3/uL (ref 150–440)
RBC: 5.38 MIL/uL (ref 4.40–5.90)
RDW: 14.1 % (ref 11.5–14.5)
WBC: 6.1 10*3/uL (ref 3.8–10.6)

## 2017-01-27 LAB — TSH: TSH: 1.241 u[IU]/mL (ref 0.350–4.500)

## 2017-01-27 MED ORDER — FLUOXETINE HCL 20 MG PO CAPS
20.0000 mg | ORAL_CAPSULE | Freq: Every day | ORAL | Status: DC
  Administered 2017-01-27 – 2017-01-28 (×2): 20 mg via ORAL
  Filled 2017-01-27 (×2): qty 1

## 2017-01-27 MED ORDER — TRAZODONE HCL 100 MG PO TABS
100.0000 mg | ORAL_TABLET | Freq: Every day | ORAL | Status: DC
  Administered 2017-01-27: 100 mg via ORAL
  Filled 2017-01-27: qty 1
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
## 2017-01-27 NOTE — [facility] Group Notes (Signed)
[facility] LCSW Group Therapy Note  Type of Therapy and Topic:  Group Therapy:  Goals Group: SMART Goals  Participation Level:  Patient did not attend group. CSW invited patient to group.   Description of Group:   The purpose of a daily goals group is to assist and guide patients in setting recovery/wellness-related goals.  The objective is to set goals as they relate to the crisis in which they were admitted. Patients will be using SMART goal modalities to set measurable goals.  Characteristics of realistic goals will be discussed and patients will be assisted in setting and processing how one will reach their goal. Facilitator will also assist patients in applying interventions and coping skills learned in psycho-education groups to the SMART goal and process how one will achieve defined goal.  Therapeutic Goals: -Patients will develop and document one goal related to or their crisis in which brought them into treatment. -Patients will be guided by LCSW using SMART goal setting modality in how to set a measurable, attainable, realistic and time sensitive goal.  -Patients will process barriers in reaching goal. -Patients will process interventions in how to overcome and successful in reaching goal.   Summary of Patient Progress:  Patient Goal: Patient did not attend group. CSW invited patient to group.    Therapeutic Modalities:   Motivational Interviewing Engineer, manufacturing systemsCognitive Behavioral Therapy Crisis Intervention Model SMART goals setting  Mackinzee Roszak G. Garnette CzechSampson MSW, LCSWA 01/27/2017 11:02 AM

## 2017-01-27 NOTE — Plan of Care (Signed)
Problem: Education: Goal: Mental status will improve Outcome: Progressing Denies AVH. Pt wanting to start back on meds he stopped taking

## 2017-01-27 NOTE — [facility] Group Notes (Signed)
[facility] Group Notes:  (Nursing/MHT/Case Management/Adjunct)  Date:  01/27/2017  Time:  1:26 AM  Type of Therapy:  Psychoeducational Skills  Participation Level:  Did Not Attend  Participation Quality:  Summary of Progress/Problems:  William NeerJackie Gonzales William Gonzales 01/27/2017, 1:26 AM

## 2017-01-27 NOTE — [facility] Group Notes (Signed)
[facility] LCSW Group Therapy  01/27/2017 1:54 PM  Type of Therapy:  Group Therapy  Participation Level:  Patient did not attend group. CSW invited patient to group.   Summary of Progress/Problems: Balance in life: Patients will discuss the concept of balance and how it looks and feels to be unbalanced. Pt will identify areas in their life that is unbalanced and ways to become more balanced. They discussed what aspects in their lives has influenced their self care. Patients also discussed self care in the areas of self regulation/control, hygiene/appearance, sleep/relaxation, healthy leisure, healthy eating habits, exercise, inner peace/spirituality, self improvement, sobriety, and health management. They were challenged to identify changes that are needed in order to improve self care.  Siraj Dermody G. Garnette Czech MSW, LCSWA 01/27/2017, 1:55 PM

## 2017-01-27 NOTE — [facility] Suicide Risk Assessment (Signed)
[facility]   Nursing information obtained from:    Demographic factors:    Current Mental Status:    Loss Factors:    Historical Factors:    Risk Reduction Factors:     Total Time spent with patient: 1 hour Principal Problem: Amphetamine and psychostimulant-induced psychosis with delusions (HCC) Diagnosis:   Patient Active Problem List   Diagnosis Date Noted  . 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 Opioid use disorder, moderate, dependence (HCC) [F11.20] 01/26/2017  . GERD (gastroesophageal reflux disease) [K21.9] 01/26/2017  . Tobacco use disorder [F17.200] 01/26/2017   Subjective Data: psychotic break.  Continued Clinical Symptoms:  Alcohol Use Disorder Identification Test Final Score (AUDIT): 0 The "Alcohol Use Disorders Identification Test", Guidelines for Use in Primary Care, Second Edition.  World Science writerHealth Organization [facility](WHO). Score between 0-7:  no or low risk or alcohol related problems. Score between 8-15:  moderate risk of alcohol related problems. Score between 16-19:  high risk of alcohol related problems. Score 20 or above:  warrants further diagnostic evaluation for alcohol dependence and treatment.   CLINICAL FACTORS:   Depression:   Comorbid alcohol abuse/dependence Delusional Impulsivity Insomnia Alcohol/Substance Abuse/Dependencies Currently Psychotic   Musculoskeletal: Strength & Muscle Tone: within normal limits Gait & Station: normal Patient leans: N/A  Psychiatric Specialty Exam: Physical Exam  Nursing note and vitals reviewed. Psychiatric: His speech is normal and behavior is normal. Thought content is paranoid and delusional. Cognition and memory are normal. He expresses impulsivity and inappropriate judgment. He exhibits a depressed mood.    Review of Systems  Psychiatric/Behavioral: Positive for  depression, hallucinations and substance abuse.  All other systems reviewed and are negative.   Blood pressure 124/78, pulse 98, temperature 97.7 F (36.5 C), temperature source Oral, resp. rate 20, height 5\' 9"  (1.753 m), weight 99.8 kg (220 lb 0.3 oz), SpO2 100 %.Body mass index is 32.49 kg/m.  General Appearance: Casual  Eye Contact:  Good  Speech:  Clear and Coherent  Volume:  Normal  Mood:  Depressed, Euthymic and Worthless  Affect:  Tearful  Thought Process:  Goal Directed and Descriptions of Associations: Intact  Orientation:  Full (Time, Place, and Person)  Thought Content:  WDL  Suicidal Thoughts:  Yes.  with intent/plan  Homicidal Thoughts:  No  Memory:  Immediate;   Fair Recent;   Fair Remote;   Fair  Judgement:  Poor  Insight:  Lacking  Psychomotor Activity:  Normal  Concentration:  Concentration: Fair and Attention Span: Fair  Recall:  [employer]Fair  Fund of Knowledge:  Fair  Language:  Fair  Akathisia:  No  Handed:  Right  AIMS (if indicated):     Assets:  Communication Skills Desire for Improvement Housing Physical Health Resilience Social Support  ADL's:  Intact  Cognition:  WNL  Sleep:  Number of Hours: 7.45      COGNITIVE FEATURES THAT CONTRIBUTE TO RISK:  None    SUICIDE RISK:   Moderate:  Frequent suicidal ideation with limited intensity, and duration, some specificity in terms of plans, no associated intent, good self-control, limited dysphoria/symptomatology, some risk factors present, and identifiable protective factors, including available and accessible social support.  PLAN OF CARE: Hospital admission, medication management, substance abuse counseling, discharge planning.  Ms. [name] is a 41 year old male with a history of mood instability and substance abuse admitted for psychotic break in the context of stimulant abuse.  1. Mood/Psychosis. He was started on Risperdal for psychosis and Prozac for depression.  2. GERD. He is on Protonix.  3.  Insomnia. Trazodone is available.   4. Smoking. Nicotine patch is available.  5. Metabolic syndrome monitoring. Lipid panel, TSH, HgbA1C are pending.  6. EKG. Pending.  7. Substance abuse. The patient was positive for opioids and amphetamines. We will offer Suboxone clinic services.  8. Legal. Legal charges pending for possession of fire arms by a felon, drug paraphernalia, drug possession. He believes he will go to prison for years.  9. Disposition. He will be discharged to home with his mother. He will follow up with [facility].  I certify that inpatient services furnished can reasonably be expected to improve the patient's condition.   Kristine Linea, MD 01/27/2017, 10:13 AM

## 2017-01-27 NOTE — Progress Notes (Signed)
Patient anxious this am. Did not request any prn. Pleasant and cooperative with care. Pt reports smoking bad meth as why he is here. Pt reports stopping his antidepressant medicine a long time ago and would like to get started on them again. Denies SI, Hi, AVH.  Encouragement and support offered. Safety checks maintained. Pt remains safe on unit with q 15 min checks.

## 2017-01-27 NOTE — Progress Notes (Signed)
Recreation Therapy Notes  Date: 03.01.18 Time: 9:30 am Location: Craft Room  Group Topic: Leisure Education  Goal Area(s) Addresses:  Patient will identify things they are grateful for. Patient will identify how being grateful can influence decision making.  Behavioral Response: Did not attend  Intervention: Grateful Wheel  Activity: Patients were given an I Am Grateful For worksheet and were instructed to write items they were grateful for under each category.  Education: LRT educated patients on leisure and why it is important.  Education Outcome: Patient did not attend group.   Clinical Observations/Feedback: Patient did not attend group.  Jacquelynn CreeGreene,Kalana Yust M, LRT/CTRS 01/27/2017 10:08 AM

## 2017-01-27 NOTE — H&P (Signed)
Psychiatric Admission Assessment Adult  Patient Identification: William Gonzales MRN:  [id number] Date of Evaluation:  01/27/2017 Chief Complaint:  Amphetamine induce physicois Principal Diagnosis: Amphetamine and psychostimulant-induced psychosis with delusions ([place]) Diagnosis:   Patient Active Problem List   Diagnosis Date Noted  . Amphetamine and psychostimulant-induced psychosis with delusions ([place]) [F15.950] 01/26/2017  . Amphetamine use disorder, moderate ([place]) [F15.20] 01/26/2017  . Opioid use disorder, moderate, dependence ([place]) [F11.20] 01/26/2017  . GERD (gastroesophageal reflux disease) [K21.9] 01/26/2017  . Tobacco use disorder [F17.200] 01/26/2017   History of Present Illness:   Identifying data. Mr. William Gonzales is a 41 year old male with a history of depression and substance use.  Chief complaint. "I thought people were shooting at me."  History of present illness. Information was obtained from the patient and the chart. The patient was brought to the emergency room by the police after he was not this running around his house with a gun behaving strangely. The patient reports that he used to amphetamine yesterday and became increasingly paranoid and started hallucinating. He believes that he could see gang members coming at him trying to kill him. He was also worried about policemen and bystanders believing that they were in danger as well. He denies any thoughts, intentions, or plans to hurt himself or others. He tells me the guns belonged to his mother. He reports that for the past year he has been feeling increasingly depressed with poor sleep, decreased appetite, anhedonia, feeling of guilt and hopelessness worthlessness, poor energy and concentration, social isolation, and crying spells. He denies experiencing psychotic symptoms or suicidal ideation prior to admission but now, with legal charges pending, he feels unsafe. He believes that he relapsed on substances to self medicate. He  denies symptoms suggestive of bipolar mania. He admits to using methamphetamines and marijuana. He was also positive for opiates. He denies alcohol use. There was a history of heavy drinking. She notes that when he starts drinking he cannot stop therefore he abstains from alcohol.  Past psychiatric history. He was hospitalized at [facility] twice for depression. He reports one suicide attempt by wrist cutting. He was tried on Prozac, Zoloft, and Paxil. He felt the Prozac was okay but other medications make him too indifferent.  Family psychiatric history. Nonreported.  Social history. He is a felon. 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 His current charges include possession of firearms, drug parafrenalia and drugs by a felon. There is a warrant out. He is afraid that he will go to prison for 10 years.  Total Time spent with patient: 1 hour  Is the patient at risk to self? Yes.    Has the patient been a risk to self in the past 6 months? No.  Has the patient been a risk to self within the distant past? Yes.    Is the patient a risk to others? No.  Has the patient been a risk to others in the past 6 months? No.  Has the patient been a risk to others within the distant past? No.   Prior Inpatient Therapy:   Prior Outpatient Therapy:    Alcohol Screening: Patient refused Alcohol Screening Tool: Yes 1. How often do you have a drink containing alcohol?: Never 9. Have you or someone else been injured as a result of your drinking?: No 10. Has a relative or friend or a doctor or another health worker been concerned about your drinking or suggested you cut down?: No  Alcohol Use Disorder Identification Test Final Score (AUDIT): 0 Brief Intervention: AUDIT score less than 7 or less-screening does not suggest unhealthy drinking-brief intervention not indicated Substance Abuse History in the last 12 months:  Yes.   Consequences of Substance  Abuse: Negative Previous Psychotropic Medications: Yes  Psychological Evaluations: No  Past Medical History:  Past Medical History:  Diagnosis Date  . Depression   . GERD (gastroesophageal reflux disease)    History reviewed. No pertinent surgical history. Family History: History reviewed. No pertinent family history.  Tobacco Screening: Have you used any form of tobacco in the last 30 days? (Cigarettes, Smokeless Tobacco, Cigars, and/or Pipes): Yes Tobacco use, Select all that apply: 5 or more cigarettes per day Are you interested in Tobacco Cessation Medications?: Yes, will notify MD for an order Counseled patient on smoking cessation including recognizing danger situations, developing coping skills and basic information about quitting provided: Refused/Declined practical counseling Social History:  History  Alcohol Use  . Yes     History  Drug Use No    Additional Social History:                           Allergies:  No Known Allergies Lab Results:  Results for orders placed or performed during the hospital encounter of 01/26/17 (from the past 48 hour(s))  CBC     Status: None   Collection Time: 01/27/17  7:20 AM  Result Value Ref Range   WBC 6.1 3.8 - 10.6 K/uL   RBC 5.38 4.40 - 5.90 MIL/uL   Hemoglobin 16.4 13.0 - 18.0 g/dL   HCT 03.7 09.6 - 43.8 %   MCV 88.5 80.0 - 100.0 fL   MCH 30.4 26.0 - 34.0 pg   MCHC 34.4 32.0 - 36.0 g/dL   RDW 38.1 84.0 - 37.5 %   Platelets 205 150 - 440 K/uL  Comprehensive metabolic panel     Status: Abnormal   Collection Time: 01/27/17  7:20 AM  Result Value Ref Range   Sodium 134 (L) 135 - 145 mmol/L   Potassium 3.5 3.5 - 5.1 mmol/L   Chloride 98 (L) 101 - 111 mmol/L   CO2 29 22 - 32 mmol/L   Glucose, Bld 101 (H) 65 - 99 mg/dL   BUN 16 6 - 20 mg/dL   Creatinine, Ser 4.36 0.61 - 1.24 mg/dL   Calcium 8.8 (L) 8.9 - 10.3 mg/dL   Total Protein 7.1 6.5 - 8.1 g/dL   Albumin 4.0 3.5 - 5.0 g/dL   AST 47 (H) 15 - 41 U/L   ALT  61 17 - 63 U/L   Alkaline Phosphatase 73 38 - 126 U/L   Total Bilirubin 1.9 (H) 0.3 - 1.2 mg/dL   GFR calc non Af Amer >60 >60 mL/min   GFR calc Af Amer >60 >60 mL/min    Comment: (NOTE) The eGFR has been calculated using the CKD EPI equation. This calculation has not been validated in all clinical situations. eGFR's persistently <60 mL/min signify possible Chronic Kidney Disease.    Anion gap 7 5 - 15  Lipid panel     Status: Abnormal   Collection Time: 01/27/17  7:20 AM  Result Value Ref Range   Cholesterol 108 0 - 200 mg/dL   Triglycerides 71 <067 mg/dL   HDL 34 (L) >70 mg/dL   Total CHOL/HDL Ratio 3.2 RATIO   VLDL 14 0 - 40 mg/dL   LDL Cholesterol 60  0 - 99 mg/dL    Comment:        Total Cholesterol/HDL:CHD Risk Coronary Heart Disease Risk Table                     Men   Women  1/2 Average Risk   3.4   3.3  Average Risk       5.0   4.4  2 X Average Risk   9.6   7.1  3 X Average Risk  23.4   11.0        Use the calculated Patient Ratio above and the CHD Risk Table to determine the patient's CHD Risk.        ATP III CLASSIFICATION (LDL):  <100     mg/dL   Optimal  100-129  mg/dL   Near or Above                    Optimal  130-159  mg/dL   Borderline  160-189  mg/dL   High  >190     mg/dL   Very High   TSH     Status: None   Collection Time: 01/27/17  7:20 AM  Result Value Ref Range   TSH 1.241 0.350 - 4.500 uIU/mL    Comment: Performed by a 3rd Generation assay with a functional sensitivity of <=0.01 uIU/mL.    Blood Alcohol level:  Lab Results  Component Value Date   ETH <5 76/73/4193    Metabolic Disorder Labs:  No results found for: HGBA1C, MPG No results found for: PROLACTIN Lab Results  Component Value Date   CHOL 108 01/27/2017   TRIG 71 01/27/2017   HDL 34 (L) 01/27/2017   CHOLHDL 3.2 01/27/2017   VLDL 14 01/27/2017   LDLCALC 60 01/27/2017    Current Medications: Current Facility-Administered Medications  Medication Dose Route Frequency  Provider Last Rate Last Dose  . acetaminophen (TYLENOL) tablet 650 mg  650 mg Oral Q6H PRN Gonzella Lex, MD      . alum & mag hydroxide-simeth (MAALOX/MYLANTA) 200-200-20 MG/5ML suspension 30 mL  30 mL Oral Q4H PRN Gonzella Lex, MD      . FLUoxetine (PROZAC) capsule 20 mg  20 mg Oral Daily Cheresa Siers B Shan Valdes, MD   20 mg at 01/27/17 0957  . magnesium hydroxide (MILK OF MAGNESIA) suspension 30 mL  30 mL Oral Daily PRN Gonzella Lex, MD      . nicotine (NICODERM CQ - dosed in mg/24 hours) patch 21 mg  21 mg Transdermal Daily Silvia Hightower B Lakina Mcintire, MD   21 mg at 01/27/17 0846  . pantoprazole (PROTONIX) EC tablet 40 mg  40 mg Oral Daily Gonzella Lex, MD   40 mg at 01/27/17 0846  . risperiDONE (RISPERDAL) tablet 1 mg  1 mg Oral BID Gonzella Lex, MD   1 mg at 01/27/17 0846  . traZODone (DESYREL) tablet 100 mg  100 mg Oral QHS Delaine Canter B Jeanmarie Mccowen, MD       PTA Medications: Prescriptions Prior to Admission  Medication Sig Dispense Refill Last Dose  . omeprazole (PRILOSEC) 20 MG capsule Take 20 mg by mouth 2 (two) times daily as needed.   PRN at PRN  . ranitidine (ZANTAC) 150 MG capsule Take 1 capsule (150 mg total) by mouth 2 (two) times daily. 28 capsule 0 PRN at PRN  . sucralfate (CARAFATE) 1 G tablet Take 1 tablet (1 g total) by mouth 4 (four) times  daily. (Patient not taking: Reported on 01/26/2017) 120 tablet 1 Not Taking at Unknown time    Musculoskeletal: Strength & Muscle Tone: within normal limits Gait & Station: normal Patient leans: N/A  Psychiatric Specialty Exam: Physical Exam  Nursing note and vitals reviewed. Psychiatric: His speech is normal and behavior is normal. Thought content is paranoid and delusional. Cognition and memory are normal. He expresses impulsivity and inappropriate judgment. He exhibits a depressed mood.    Review of Systems  Psychiatric/Behavioral: Positive for hallucinations and substance abuse.  All other systems reviewed and are negative.   Blood  pressure 124/78, pulse 98, temperature 97.7 F (36.5 C), temperature source Oral, resp. rate 20, height '5\' 9"'$  (1.753 m), weight 99.8 kg (220 lb 0.3 oz), SpO2 100 %.Body mass index is 32.49 kg/m.  See SRA.                                                  Sleep:  Number of Hours: 7.45    Treatment Plan Summary: Daily contact with patient to assess and evaluate symptoms and progress in treatment and Medication management   Mr. Cappelletti is a 41 year old male with a history of mood instability and substance abuse admitted for psychotic break in the context of stimulant abuse.  1. Mood/Psychosis. He was started on Risperdal for psychosis and Prozac for depression.  2. GERD. He is on Protonix.  3. Insomnia. Trazodone is available.   4. Smoking. Nicotine patch is available.  5. Metabolic syndrome monitoring. Lipid panel, TSH, HgbA1C are pending.  6. EKG. Pending.  7. Substance abuse. The patient was positive for opioids and amphetamines. We will offer Suboxone clinic services.  8. Legal. Legal charges pending for possession of fire arms by a felon, drug paraphernalia, drug possession. He believes he will go to prison for years.  9. Disposition. He will be discharged to home with his mother. He will follow up with [facility].   Observation Level/Precautions:  15 minute checks  Laboratory:  CBC Chemistry Profile UDS UA  Psychotherapy:    Medications:    Consultations:    Discharge Concerns:    Estimated LOS:  Other:     Physician Treatment Plan for Primary Diagnosis: Amphetamine and psychostimulant-induced psychosis with delusions ([place]) Long Term Goal(s): Improvement in symptoms so as ready for discharge  Short Term Goals: Ability to identify changes in lifestyle to reduce recurrence of condition will improve, Ability to verbalize feelings will improve, Ability to disclose and discuss suicidal ideas, Ability to demonstrate self-control will improve, Ability to  identify and develop effective coping behaviors will improve, Ability to maintain clinical measurements within normal limits will improve, Compliance with prescribed medications will improve and Ability to identify triggers associated with substance abuse/mental health issues will improve  Physician Treatment Plan for Secondary Diagnosis: Principal Problem:   Amphetamine and psychostimulant-induced psychosis with delusions ([place]) Active Problems:   Amphetamine use disorder, moderate (HCC)   Opioid use disorder, moderate, dependence (HCC)   GERD (gastroesophageal reflux disease)   Tobacco use disorder  Long Term Goal(s): Improvement in symptoms so as ready for discharge  Short Term Goals: Ability to identify changes in lifestyle to reduce recurrence of condition will improve, Ability to demonstrate self-control will improve and Ability to identify triggers associated with substance abuse/mental health issues will improve  I certify that inpatient services furnished  can reasonably be expected to improve the patient's condition.    Orson Slick, MD 3/1/201810:18 AM

## 2017-01-28 ENCOUNTER — Encounter: Payer: Self-pay | Admitting: Emergency Medicine

## 2017-01-28 ENCOUNTER — Emergency Department
Admission: EM | Admit: 2017-01-28 | Discharge: 2017-01-29 | Discharge status: Against Medical Advice | Payer: Self-pay | Attending: Emergency Medicine | Admitting: Emergency Medicine

## 2017-01-28 DIAGNOSIS — F17200 Nicotine dependence, unspecified, uncomplicated: Secondary | ICD-10-CM | POA: Insufficient documentation

## 2017-01-28 DIAGNOSIS — T7840XA Allergy, unspecified, initial encounter: Secondary | ICD-10-CM | POA: Insufficient documentation

## 2017-01-28 LAB — HEMOGLOBIN A1C
Hgb A1c MFr Bld: 5.3 % (ref 4.8–5.6)
MEAN PLASMA GLUCOSE: 105 mg/dL

## 2017-01-28 MED ORDER — FLUOXETINE HCL 20 MG PO CAPS: 20.0000 mg | ORAL_CAPSULE | Freq: Every day | ORAL | 1 refills | Status: DC

## 2017-01-28 MED ORDER — DIPHENHYDRAMINE HCL 50 MG/ML IJ SOLN
50.0000 mg | Freq: Once | INTRAMUSCULAR | Status: AC
  Administered 2017-01-28: 50 mg via INTRAVENOUS

## 2017-01-28 MED ORDER — RISPERIDONE 1 MG PO TABS: 1.0000 mg | ORAL_TABLET | Freq: Two times a day (BID) | ORAL | 1 refills | Status: AC

## 2017-01-28 MED ORDER — TRAZODONE HCL 100 MG PO TABS: 100.0000 mg | ORAL_TABLET | Freq: Every day | ORAL | 1 refills | Status: DC

## 2017-01-28 MED ORDER — PANTOPRAZOLE SODIUM 40 MG PO TBEC: 40.0000 mg | DELAYED_RELEASE_TABLET | Freq: Every day | ORAL | 1 refills | Status: DC

## 2017-01-28 MED ORDER — TRAZODONE HCL 100 MG PO TABS: 100.0000 mg | ORAL_TABLET | Freq: Every day | ORAL | 1 refills | Status: AC

## 2017-01-28 MED ORDER — FAMOTIDINE IN NACL 20-0.9 MG/50ML-% IV SOLN
20.0000 mg | Freq: Once | INTRAVENOUS | Status: AC
  Administered 2017-01-28: 20 mg via INTRAVENOUS

## 2017-01-28 MED ORDER — RISPERIDONE 1 MG PO TABS: 1.0000 mg | ORAL_TABLET | Freq: Two times a day (BID) | ORAL | 1 refills | Status: DC

## 2017-01-28 MED ORDER — METHYLPREDNISOLONE SODIUM SUCC 125 MG IJ SOLR
125.0000 mg | Freq: Once | INTRAMUSCULAR | Status: AC
  Administered 2017-01-28: 125 mg via INTRAVENOUS

## 2017-01-28 NOTE — Progress Notes (Signed)
Pt states giving away red t-shirt to Ms. Pavur. Informed pt of the policy and guidelines exchanging and giving away personnel belongings. Verbalized understanding.

## 2017-01-28 NOTE — Plan of Care (Signed)
Problem: Coping: Goal: Ability to verbalize frustrations and anger appropriately will improve Outcome: Not Met (add Reason) Patient remains irritable. Guarded. Isolates in room except for medications. Refused group. No PRNs given. No c/o pain/discomfort noted.

## 2017-01-28 NOTE — Tx Team (Signed)
Interdisciplinary Treatment and Diagnostic Plan Update  01/28/2017 Time of Session:1035 BIAGIO SNELSON MRN: [id number]  Principal Diagnosis: Amphetamine and psychostimulant-induced psychosis with delusions (HCC)  Secondary Diagnoses: Principal Problem:   Amphetamine and psychostimulant-induced psychosis with delusions (HCC) Active Problems:   Amphetamine use disorder, moderate (HCC)   Opioid use disorder, moderate, dependence (HCC)   GERD (gastroesophageal reflux disease)   Tobacco use disorder   Severe recurrent major depressive disorder with psychotic features (HCC)   Current Medications:  No current facility-administered medications for this encounter.    Current Outpatient Prescriptions  Medication Sig Dispense Refill  . [START ON 01/29/2017] FLUoxetine (PROZAC) 20 MG capsule Take 1 capsule (20 mg total) by mouth daily. 30 capsule 1  . [START ON 01/29/2017] pantoprazole (PROTONIX) 40 MG tablet Take 1 tablet (40 mg total) by mouth daily. 30 tablet 1  . risperiDONE (RISPERDAL) 1 MG tablet Take 1 tablet (1 mg total) by mouth 2 (two) times daily. 60 tablet 1  . traZODone (DESYREL) 100 MG tablet Take 1 tablet (100 mg total) by mouth at bedtime. 30 tablet 1   PTA Medications: No prescriptions prior to admission.    Patient Stressors: Medication change or noncompliance Substance abuse  Patient Strengths: Capable of independent living Physical Health  Treatment Modalities: Medication Management, Group therapy, Case management,  1 to 1 session with clinician, Psychoeducation, Recreational therapy.   Physician Treatment Plan for Primary Diagnosis: Amphetamine and psychostimulant-induced psychosis with delusions (HCC) Long Term Goal(s): Improvement in symptoms so as ready for discharge Improvement in symptoms so as ready for discharge   Short Term Goals: Ability to identify changes in lifestyle to reduce recurrence of condition will improve Ability to verbalize feelings will  improve Ability to disclose and discuss suicidal ideas Ability to demonstrate self-control will improve Ability to identify and develop effective coping behaviors will improve Ability to maintain clinical measurements within normal limits will improve Compliance with prescribed medications will improve Ability to identify triggers associated with substance abuse/mental health issues will improve Ability to identify changes in lifestyle to reduce recurrence of condition will improve Ability to demonstrate self-control will improve Ability to identify triggers associated with substance abuse/mental health issues will improve  Medication Management: Evaluate patient's response, side effects, and tolerance of medication regimen.  Therapeutic Interventions: 1 to 1 sessions, Unit Group sessions and Medication administration.  Evaluation of Outcomes: Adequate for Discharge  Physician Treatment Plan for Secondary Diagnosis: Principal Problem:   Amphetamine and psychostimulant-induced psychosis with delusions (HCC) Active Problems:   Amphetamine use disorder, moderate (HCC)   Opioid use disorder, moderate, dependence (HCC)   GERD (gastroesophageal reflux disease)   Tobacco use disorder   Severe recurrent major depressive disorder with psychotic features (HCC)  Long Term Goal(s): Improvement in symptoms so as ready for discharge Improvement in symptoms so as ready for discharge   Short Term Goals: Ability to identify changes in lifestyle to reduce recurrence of condition will improve Ability to verbalize feelings will improve Ability to disclose and discuss suicidal ideas Ability to demonstrate self-control will improve Ability to identify and develop effective coping behaviors will improve Ability to maintain clinical measurements within normal limits will improve Compliance with prescribed medications will improve Ability to identify triggers associated with substance abuse/mental health  issues will improve Ability to identify changes in lifestyle to reduce recurrence of condition will improve Ability to demonstrate self-control will improve Ability to identify triggers associated with substance abuse/mental health issues will improve     Medication Management:  Evaluate patient's response, side effects, and tolerance of medication regimen.  Therapeutic Interventions: 1 to 1 sessions, Unit Group sessions and Medication administration.  Evaluation of Outcomes: Adequate for Discharge   RN Treatment Plan for Primary Diagnosis: Amphetamine and psychostimulant-induced psychosis with delusions (HCC) Long Term Goal(s): Knowledge of disease and therapeutic regimen to maintain health will improve  Short Term Goals: Ability to verbalize frustration and anger appropriately will improve and Ability to demonstrate self-control  Medication Management: RN will administer medications as ordered by provider, will assess and evaluate patient's response and provide education to patient for prescribed medication. RN will report any adverse and/or side effects to prescribing provider.  Therapeutic Interventions: 1 on 1 counseling sessions, Psychoeducation, Medication administration, Evaluate responses to treatment, Monitor vital signs and CBGs as ordered, Perform/monitor CIWA, COWS, AIMS and Fall Risk screenings as ordered, Perform wound care treatments as ordered.  Evaluation of Outcomes: Adequate for Discharge   LCSW Treatment Plan for Primary Diagnosis: Amphetamine and psychostimulant-induced psychosis with delusions (HCC) Long Term Goal(s): Safe transition to appropriate next level of care at discharge, Engage patient in therapeutic group addressing interpersonal concerns.  Short Term Goals: Engage patient in aftercare planning with referrals and resources and Identify triggers associated with mental health/substance abuse issues  Therapeutic Interventions: Assess for all discharge needs,  1 to 1 time with Social worker, Explore available resources and support systems, Assess for adequacy in community support network, Educate family and significant other(s) on suicide prevention, Complete Psychosocial Assessment, Interpersonal group therapy.  Evaluation of Outcomes: Adequate for Discharge   Progress in Treatment: Attending groups: No. Participating in groups: No. Taking medication as prescribed: Yes. Toleration medication: Yes. Family/Significant other contact made: Yes, individual(s) contacted:  mother Patient understands diagnosis: Yes. Discussing patient identified problems/goals with staff: Yes. Medical problems stabilized or resolved: Yes. Denies suicidal/homicidal ideation: Yes. Issues/concerns per patient self-inventory: No. Other: none  New problem(s) identified: No, Describe:  none  New Short Term/Long Term Goal(s):  Discharge Plan or Barriers: PT will follow up with [facility] for outpatient services.  Reason for Continuation of Hospitalization: Other; describe none.  Estimated Length of Stay: Discharge today.  Attendees: Patient: William RutherfordRobert Aldaz 01/28/2017   Physician: Dr. Jennet MaduroPucilowska, MD 01/28/2017   Nursing: Ocie CornfieldPhyliss Cobb, RN 01/28/2017   RN Care Manager: 01/28/2017   Social Worker: Daleen SquibbGreg Chalmer Zheng, LCSW 01/28/2017   Recreational Therapist: Hershal CoriaBeth Greene, LRT/CTRS  01/28/2017   Other:  01/28/2017  Other:  01/28/2017   Other: 01/28/2017        Scribe for Treatment Team: Lorri FrederickWierda, Madden Piazza Jon, LCSW 01/28/2017 2:31 PM

## 2017-01-28 NOTE — [facility] Suicide Risk Assessment (Signed)
[facility]   Principal Problem: Amphetamine and psychostimulant-induced psychosis with delusions [facility](HCC) Discharge Diagnoses:  Patient Active Problem List   Diagnosis Date Noted  . Severe recurrent major depressive disorder with psychotic features (HCC) [F33.3] 01/27/2017  . 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 Opioid use disorder, moderate, dependence (HCC) [F11.20] 01/26/2017  . GERD (gastroesophageal reflux disease) [K21.9] 01/26/2017  . Tobacco use disorder [F17.200] 01/26/2017    Total Time spent with patient: 30 minutes  Musculoskeletal: Strength & Muscle Tone: within normal limits Gait & Station: normal Patient leans: N/A  Psychiatric Specialty Exam: Review of Systems  Psychiatric/Behavioral: Positive for substance abuse.  All other systems reviewed and are negative.   Blood pressure (!) 145/89, pulse 96, temperature 98.1 F (36.7 C), temperature source Oral, resp. rate 18, height 5\' 9"  (1.753 m), weight 99.8 kg (220 lb 0.3 oz), SpO2 100 %.Body mass index is 32.49 kg/m.  General Appearance: Casual  Eye Contact::  Good  Speech:  Clear and Coherent409  Volume:  Normal  Mood:  Anxious  Affect:  Appropriate  Thought Process:  Goal Directed and Descriptions of Associations: Intact  Orientation:  Full (Time, Place, and Person)  Thought Content:  WDL  Suicidal Thoughts:  No  Homicidal Thoughts:  No  Memory:  Immediate;   Fair Recent;   Fair Remote;   Fair  Judgement:  Poor  Insight:  Shallow  Psychomotor Activity:  Normal  Concentration:  Fair  Recall:  [employer]Fair  Fund of Knowledge:Fair  Language: Fair  Akathisia:  No  Handed:  Right  AIMS (if indicated):     Assets:  Communication Skills Desire for Improvement Housing Physical Health Resilience Social Support  Sleep:  Number of Hours: 7.15  Cognition: WNL  ADL's:   Intact   Mental Status Per Nursing Assessment::   On Admission:     Demographic Factors:  Male, Divorced or widowed, Caucasian, Low socioeconomic status and Unemployed  Loss Factors: Legal issues  Historical Factors: Prior suicide attempts, Family history of mental illness or substance abuse and Impulsivity  Risk Reduction Factors:   Sense of responsibility to family, Living with another person, especially a relative and Positive social support  Continued Clinical Symptoms:  Depression:   Comorbid alcohol abuse/dependence Impulsivity Alcohol/Substance Abuse/Dependencies  Cognitive Features That Contribute To Risk:  None    Suicide Risk:  Minimal: No identifiable suicidal ideation.  Patients presenting with no risk factors but with morbid ruminations; may be classified as minimal risk based on the severity of the depressive symptoms    Plan Of Care/Follow-up recommendations:  Activity:  As tolerated. Diet:  Low sodium heart healthy. Other:  Keep follow-up appointments.  Kristine LineaJolanta Henry Utsey, MD 01/28/2017, 9:05 AM

## 2017-01-28 NOTE — [facility] Group Notes (Signed)
[facility] LCSW Group Therapy  01/28/2017 3:49 PM  Type of Therapy:  Group Therapy  Participation Level:  Minimal  Participation Quality:  Attentive  Affect:  Appropriate  Cognitive:  Alert  Insight:  Improving  Engagement in Therapy:  Improving  Modes of Intervention:  Activity, Discussion, Education, Problem-solving, Reality Testing and Support  Summary of Progress/Problems: Feelings around Relapse. Group members discussed the meaning of relapse and shared personal stories of relapse, how it affected them and others, and how they perceived themselves during this time. Group members were encouraged to identify triggers, warning signs and coping skills used when facing the possibility of relapse. Social supports were discussed and explored in detail. Patients also discussed facing disappointment and how that can trigger someone to relapse.   Sharra Cayabyab G. Garnette CzechSampson MSW, LCSWA 01/28/2017, 3:50 PM

## 2017-01-28 NOTE — ED Triage Notes (Signed)
Pt presents to ED with c/o potential allergic reaction with sudden swelling tongue. Pt reports that he started taking trazodone, risperidone, fluoxetine, and pantoprazole this weeks. States he took trazodone and risperidone about 3 hours ago. Airway intact.

## 2017-01-28 NOTE — Progress Notes (Signed)
  [facility] :  Will you be returning to the same living situation after discharge:  Yes,  mother's home At discharge, do you have transportation home?: Yes,  mother Do you have the ability to pay for your medications: No. Pt referred to medication management clinic.  Release of information consent forms completed and in the chart;  Patient's signature needed at discharge.  Patient to Follow up at: Follow-up Information    [facility].   Why:  [facility] 12:30pm.  Please bring photo ID and a copy of your hospital discharge paperwork. Contact information: [address] [place] [address] [phone]           Next level of care provider has access to [facility] Link:no  Safety Planning and Suicide Prevention discussed: Yes,  mother  Have you used any form of tobacco in the last 30 days? (Cigarettes, Smokeless Tobacco, Cigars, and/or Pipes): Yes  Has patient been referred to the Quitline?: Patient refused referral  Patient has been referred for addiction treatment: Yes  Lorri FrederickWierda, Leniya Breit Jon, LCSW 01/28/2017, 2:48 PM

## 2017-01-28 NOTE — Progress Notes (Signed)
Provided and reviewed discharge paperwork and prescriptions. Verified understanding by use of teach back method. Verbalizes understanding as well. Denies SI/HI/AVH, pain. Pt belongings to be returned from pt specific locker and security safe as noted on discharge. Pt's mother to pick him up and transport him home, on the way now. Safety maintained with every 15 minute checks. Will continue to monitor.

## 2017-01-28 NOTE — Discharge Summary (Signed)
Physician Discharge Summary Note  Patient:  William Gonzales is an 41 y.o., male MRN:  [id number] DOB:  02/11/1976 Patient phone:  [phone] (home)  Patient address:   [address] [place] [address],  Total Time spent with patient: 30 minutes  Date of Admission:  01/26/2017 Date of Discharge: 01/28/2017  Reason for Admission:  Psychotic break.  Identifying data. Mr. William Gonzales is a 41 year old male with a history of depression and substance use.  Chief complaint. "I thought people were shooting at me."  History of present illness. Information was obtained from the patient and the chart. The patient was brought to the emergency room by the police after he was not this running around his house with a gun behaving strangely. The patient reports that he used to amphetamine yesterday and became increasingly paranoid and started hallucinating. He believes that he could see gang members coming at him trying to kill him. He was also worried about policemen and bystanders believing that they were in danger as well. He denies any thoughts, intentions, or plans to hurt himself or others. He tells me the guns belonged to his mother. He reports that for the past year he has been feeling increasingly depressed with poor sleep, decreased appetite, anhedonia, feeling of guilt and hopelessness worthlessness, poor energy and concentration, social isolation, and crying spells. He denies experiencing psychotic symptoms or suicidal ideation prior to admission but now, with legal charges pending, he feels unsafe. He believes that he relapsed on substances to self medicate. He denies symptoms suggestive of bipolar mania. He admits to using methamphetamines and marijuana. He was also positive for opiates. He denies alcohol use. There was a history of heavy drinking. She notes that when he starts drinking he cannot stop therefore he abstains from alcohol.  Past psychiatric history. [facility] for [facility]. He reports one suicide attempt by wrist cutting. He was tried on Prozac, Zoloft, and Paxil. He felt the Prozac was okay but other medications make him too indifferent.  Family psychiatric history. Nonreported.  Social history. He is a felon. 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 His current charges include possession of firearms, drug parafrenalia and drugs by a felon. There is a warrant out. He is afraid that he will go to prison for 10 years.  Principal Problem: Amphetamine and psychostimulant-induced psychosis with delusions [facility](HCC) Discharge Diagnoses: Patient Active Problem List   Diagnosis Date Noted  . Severe recurrent major depressive disorder with psychotic features (HCC) [F33.3] 01/27/2017  . 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 Opioid use disorder, moderate, dependence (HCC) [F11.20] 01/26/2017  . GERD (gastroesophageal reflux disease) [K21.9] 01/26/2017  . Tobacco use disorder [F17.200] 01/26/2017   Past Medical History:  Past Medical History:  Diagnosis Date  . Depression   . GERD (gastroesophageal reflux disease)    History reviewed. No pertinent surgical history. Family History: History reviewed. No pertinent family history.  Social History:  History  Alcohol Use  . Yes     History  Drug Use No    Social History   Social History  . Marital status: Single    Spouse name: N/A  . Number of children: N/A  . Years of education: N/A   Social History Main Topics  . Smoking status: Current Every Day Smoker  . Smokeless tobacco: Current User  . Alcohol use  Yes  . Drug use: No  . Sexual activity: Not Asked   Other Topics Concern  . None   Social History Narrative  . None    Hospital Course:    Mr. William Gonzales is a 41 year old male with a history of mood instability  and substance abuse admitted for a psychotic break in the context of stimulant abuse.  1. Mood/Psychosis. He was started on Risperdal for psychosis and Prozac for depression.  2. GERD. He is on Protonix.  3. Insomnia. Trazodone is available.   4. Smoking. Nicotine patch is available.  5. Metabolic syndrome monitoring. Lipid panel, TSH, and HgbA1C are normal.  6. EKG. Normal sinus rhythm. QTc 454.  7. Substance abuse. The patient was positive for opioids and amphetamines. He will follow up with [facility] where Suboxone is available.   8. Legal. Legal charges pending for possession of fire arms, drug paraphernalia, and drug possession by a felon. He believes he will go to prison for years.  9. Disposition. He was discharged to home with his mother. He will follow up with [facility].  Physical Findings: AIMS:  , ,  ,  ,    CIWA:    COWS:     Musculoskeletal: Strength & Muscle Tone: within normal limits Gait & Station: normal Patient leans: N/A  Psychiatric Specialty Exam: Physical Exam  Nursing note and vitals reviewed. Psychiatric: His speech is normal and behavior is normal. Thought content normal. His mood appears anxious. Cognition and memory are normal. He expresses impulsivity.    Review of Systems  Psychiatric/Behavioral: Positive for substance abuse.  All other systems reviewed and are negative.   Blood pressure (!) 145/89, pulse 96, temperature 98.1 F (36.7 C), temperature source Oral, resp. rate 18, height 5\' 9"  (1.753 m), weight 99.8 kg (220 lb 0.3 oz), SpO2 100 %.Body mass index is 32.49 kg/m.  General Appearance: Casual  Eye Contact:  Good  Speech:  Clear and Coherent  Volume:  Normal  Mood:  Anxious  Affect:  Appropriate  Thought Process:  Goal Directed and Descriptions of Associations: Intact  Orientation:  Full (Time, Place, and Person)  Thought Content:  WDL  Suicidal Thoughts:  No  Homicidal Thoughts:  No  Memory:  Immediate;   Fair Recent;    Fair Remote;   Fair  Judgement:  Poor  Insight:  Shallow  Psychomotor Activity:  Normal  Concentration:  Concentration: Fair and Attention Span: Fair  Recall:  [employer] of Knowledge:  Fair  Language:  Fair  Akathisia:  No  Handed:  Right  AIMS (if indicated):     Assets:  Communication Skills Desire for Improvement Housing Physical Health Resilience Social Support  ADL's:  Intact  Cognition:  WNL  Sleep:  Number of Hours: 7.15     Have you used any form of tobacco in the last 30 days? (Cigarettes, Smokeless Tobacco, Cigars, and/or Pipes): Yes  Has this patient used any form of tobacco in the last 30 days? (Cigarettes, Smokeless Tobacco, Cigars, and/or Pipes) Yes, Yes, A prescription for an FDA-approved tobacco cessation medication was offered at discharge and the patient refused  Blood Alcohol level:  Lab Results  Component Value Date   [facility] <5 01/26/2017    Metabolic Disorder Labs:  Lab Results  Component Value Date   HGBA1C 5.3 01/27/2017   MPG 105 01/27/2017   No results found for: PROLACTIN Lab Results  Component Value Date   CHOL 108 01/27/2017   TRIG 71  01/27/2017   HDL 34 (L) 01/27/2017   CHOLHDL 3.2 01/27/2017   VLDL 14 01/27/2017   LDLCALC 60 01/27/2017    See Psychiatric Specialty Exam and Suicide Risk Assessment completed by Attending Physician prior to discharge.  Discharge destination:  Home  Is patient on multiple antipsychotic therapies at discharge:  No   Has Patient had three or more failed trials of antipsychotic monotherapy by history:  No  Recommended Plan for Multiple Antipsychotic Therapies: NA  Discharge Instructions    Diet - low sodium heart healthy    Complete by:  As directed    Increase activity slowly    Complete by:  As directed      Allergies as of 01/28/2017   No Known Allergies     Medication List    STOP taking these medications   omeprazole 20 MG capsule Commonly known as:  PRILOSEC   ranitidine 150 MG  capsule Commonly known as:  ZANTAC   sucralfate 1 g tablet Commonly known as:  CARAFATE     TAKE these medications     Indication  FLUoxetine 20 MG capsule Commonly known as:  PROZAC Take 1 capsule (20 mg total) by mouth daily. Start taking on:  01/29/2017  Indication:  Depression   pantoprazole 40 MG tablet Commonly known as:  PROTONIX Take 1 tablet (40 mg total) by mouth daily. Start taking on:  01/29/2017  Indication:  Gastroesophageal Reflux Disease   risperiDONE 1 MG tablet Commonly known as:  RISPERDAL Take 1 tablet (1 mg total) by mouth 2 (two) times daily.  Indication:  Major Depressive Disorder   traZODone 100 MG tablet Commonly known as:  DESYREL Take 1 tablet (100 mg total) by mouth at bedtime.  Indication:  Trouble Sleeping        Follow-up recommendations:  Activity:  As tolerated. Diet:  Low sodium heart healthy. Other:  Keep follow-up appointments.  Comments:    Signed: Kristine Linea, MD 01/28/2017, 9:08 AM

## 2017-01-28 NOTE — [facility] Group Notes (Signed)
[facility] Group Notes:  (Nursing/MHT/Case Management/Adjunct)  Date:  01/28/2017  Time:  3:53 AM  Type of Therapy:  Psychoeducational Skills  Participation Level:  Did Not Attend  Summary of Progress/Problems:  William MilroyLaquanda Y [name] 01/28/2017, 3:53 AM

## 2017-01-28 NOTE — [facility] Suicide Risk Assessment (Signed)
[facility] INPATIENT:  Family/Significant Other Suicide Prevention Education  Suicide Prevention Education:  Education Completed; Sharon SellerSharon Fischer, mother, [phone], has been identified by the patient as the family member/significant other with whom the patient will be residing, and identified as the person(s) who will aid the patient in the event of a mental health crisis (suicidal ideations/suicide attempt).  With written consent from the patient, the family member/significant other has been provided the following suicide prevention education, prior to the and/or following the discharge of the patient.  The suicide prevention education provided includes the following:  Suicide risk factors  Suicide prevention and interventions  National Suicide Hotline telephone number  [facility] telephone number  [facility] 911  [facility] and/or [facility]idential Mobile Crisis [facility] telephone number  Request made of family/significant other to:  Remove weapons (e.g., guns, rifles, knives), all items previously/currently identified as safety concern. All guns in the home (3) were confiscated by police.  Remove drugs/medications (over-the-counter, prescriptions, illicit drugs), all items previously/currently identified as a safety concern.  The family member/significant other verbalizes understanding of the suicide prevention education information provided.  The family member/significant other agrees to remove the items of safety concern listed above.  Lorri FrederickWierda, Cordarro Spinnato Jon, LCSW 01/28/2017, 10:02 AM

## 2017-01-28 NOTE — ED Provider Notes (Signed)
[facility] Emergency Department Provider Note   ____________________________________________   First MD Initiated Contact with Patient 01/28/17 2205     (approximate)  I have reviewed the triage vital signs and the nursing notes.   HISTORY  Chief Complaint Allergic Reaction    HPI William Gonzales is a 41 y.o. male who comes on in with sudden onset of swelling in his tongue. As documented in the nurse's notes he says he began taking trazodone and risperidone and fluoxetine and picked pantoprazole this week. Tongue was swelling. His speech is just slightly slurry. He is not short of breath and has no stridor. He has never had this before. He does not take any antihypertensive.   Past Medical History:  Diagnosis Date  . Depression   . GERD (gastroesophageal reflux disease)     Patient Active Problem List   Diagnosis Date Noted  . Severe recurrent major depressive disorder with psychotic features (HCC) 01/27/2017  . Amphetamine and psychostimulant-induced psychosis with delusions (HCC) 01/26/2017  . Amphetamine use disorder, moderate (HCC) 01/26/2017  . Opioid use disorder, moderate, dependence (HCC) 01/26/2017  . GERD (gastroesophageal reflux disease) 01/26/2017  . Tobacco use disorder 01/26/2017    No past surgical history on file.  Prior to Admission medications   Medication Sig Start Date End Date Taking? Authorizing Provider  FLUoxetine (PROZAC) 20 MG capsule Take 1 capsule (20 mg total) by mouth daily. 01/29/17   Shari Prows, MD  pantoprazole (PROTONIX) 40 MG tablet Take 1 tablet (40 mg total) by mouth daily. 01/29/17   Shari Prows, MD  risperiDONE (RISPERDAL) 1 MG tablet Take 1 tablet (1 mg total) by mouth 2 (two) times daily. 01/28/17   Shari Prows, MD  traZODone (DESYREL) 100 MG tablet Take 1 tablet (100 mg total) by mouth at bedtime. 01/28/17   Shari Prows, MD    Allergies Patient has no known  allergies.  No family history on file.  Social History Social History  Substance Use Topics  . Smoking status: Current Every Day Smoker    Packs/day: 1.50  . Smokeless tobacco: Current User  . Alcohol use Yes    Review of Systems Constitutional: No fever/chills Eyes: No visual changes. ENT: No sore throat. Cardiovascular: Denies chest pain. Respiratory: Denies shortness of breath. Gastrointestinal: No abdominal pain.  No nausea, no vomiting.  No diarrhea.  No constipation. Genitourinary: Negative for dysuria. Musculoskeletal: Negative for back pain. Skin: Negative for rash. Neurological: Negative for headaches, focal weakness or numbness.  10-point ROS otherwise negative.  ____________________________________________   PHYSICAL EXAM:  VITAL SIGNS: ED Triage Vitals  Enc Vitals Group     BP 01/28/17 2215 (!) 136/95     Pulse Rate 01/28/17 2215 91     Resp 01/28/17 2215 15     Temp 01/28/17 2215 98.8 F (37.1 C)     Temp Source 01/28/17 2215 Oral     SpO2 01/28/17 2215 99 %     Weight 01/28/17 2216 220 lb (99.8 kg)     Height 01/28/17 2216 5\' 9"  (1.753 m)     Head Circumference --      Peak Flow --      Pain Score 01/28/17 2217 0     Pain Loc --      Pain Edu? --      Excl. in GC? --     Constitutional: Alert and oriented. Well appearing and in no acute distress. Eyes: Conjunctivae are normal.  PERRL. EOMI. Head: Atraumatic. Nose: No congestion/rhinnorhea. Mouth/Throat: Mucous membranes are moist.  Oropharynx non-erythematous right side of his tongue is somewhat swollen.. Neck: No stridor.  Cardiovascular: Normal rate, regular rhythm. Grossly normal heart sounds.  Good peripheral circulation. Respiratory: Normal respiratory effort.  No retractions. Lungs CTAB. Gastrointestinal: Soft and nontender. No distention. No abdominal bruits. No CVA tenderness. Musculoskeletal: No lower extremity tenderness nor edema.  No joint effusions. Neurologic:  Normal speech and  language. No gross focal neurologic deficits are appreciated. No gait instability. Skin:  Skin is warm, dry and intact. No rash noted. Psychiatric: Mood and affect are normal. Speech is slightly slurry from his tongue I believe. behavior are normal.  ____________________________________________   LABS (all labs ordered are listed, but only abnormal results are displayed)  Labs Reviewed - No data to display ____________________________________________  EKG   ____________________________________________  RADIOLOGY  ____________________________________________   PROCEDURES  Procedure(s) performed:   Procedures  Critical Care performed:   ____________________________________________   INITIAL IMPRESSION / ASSESSMENT AND PLAN / ED COURSE  Pertinent labs & imaging results that were available during my care of the patient were reviewed by me and considered in my medical decision making (see chart for details).   Will consult psych as to which medications to use. Dr. Vernia BuffPad. Will follow up     ____________________________________________   FINAL CLINICAL IMPRESSION(S) / ED DIAGNOSES  Final diagnoses:  Allergic reaction, initial encounter      NEW MEDICATIONS STARTED DURING THIS VISIT:  New Prescriptions   No medications on file     Note:  This document was prepared using Dragon voice recognition software and may include unintentional dictation errors.    Arnaldo NatalPaul F Devaun Hernandez, MD 01/29/17 [phone]

## 2017-01-29 NOTE — ED Notes (Addendum)
Spoke with Dr Lenard LancePaduchowski and pt to be d/c'd AMA at this time. Pt states he is not willing to reconsider his decision to stay [facility] what new medications, if any, may be responsible for causing his allergic reaction. Pt states he will not "take either damn one" of the two that he believes is responsible, and is leaving both bottles of medications for the ED to destroy/dispose. Pt informed that he may be allergic to a different medication other than the two he believes, and that he runs the risk of having another life-threatening allergic reaction similar to what brought him to the ED tonight. Pt states he "will figure that out when it happens" and is still adamant about leaving AMA. Pt agrees to sign out AMA at this time, and is ambulatory to the lobby without any difficulty or distress observed. Lea, Charge RN, made aware; medications counted and given to pharmacy: (6) 100mg  Trazadone tablets and (13) 1mg  Risperidone tablets.

## 2017-01-29 NOTE — ED Notes (Signed)
SOC machine set up at bedside.  

## 2017-01-29 NOTE — ED Notes (Signed)
Pt very agitated and swearing/complaining [facility]; pt demanding for PIV to be removed and requesting to leave at this time. Pt asked to wait for a few moments while EDP notified of pt's wishes/concerns.

## 2017-01-29 NOTE — ED Provider Notes (Signed)
-----------------------------------------   5:24 AM on 01/29/2017 -----------------------------------------  The patient is requesting to leave against medical advise. He continues to wait for specialist on call. Specialist on-call states they're extremely backed up tonight and he still has two patient's in front of our patient. Patient has signed out AGAINST MEDICAL ADVICE   Minna AntisKevin Audi Wettstein, MD 01/29/17 [phone]

## 2017-03-02 ENCOUNTER — Ambulatory Visit: Payer: Self-pay | Admitting: Pharmacy Technician

## 2017-03-02 NOTE — Progress Notes (Signed)
Patient scheduled for eligibility appointment at [facility]  Patient did not show for the appointment on 03/02/17 at 2:00p.m.  Patient did not reschedule eligibility appointment.  [facility] will be unable to provide ongoing medication assistance until eligibility is determined.  Sherilyn Dacosta Care Manager [facility]

## 2018-06-01 ENCOUNTER — Other Ambulatory Visit: Payer: Self-pay

## 2018-06-01 ENCOUNTER — Emergency Department
Admission: EM | Admit: 2018-06-01 | Discharge: 2018-06-01 | Disposition: A | Discharge status: Home / Self Care | Payer: Self-pay | Attending: Emergency Medicine | Admitting: Emergency Medicine

## 2018-06-01 ENCOUNTER — Encounter: Payer: Self-pay | Admitting: Emergency Medicine

## 2018-06-01 DIAGNOSIS — F17200 Nicotine dependence, unspecified, uncomplicated: Secondary | ICD-10-CM | POA: Insufficient documentation

## 2018-06-01 DIAGNOSIS — L03211 Cellulitis of face: Secondary | ICD-10-CM

## 2018-06-01 DIAGNOSIS — Z79899 Other long term (current) drug therapy: Secondary | ICD-10-CM | POA: Insufficient documentation

## 2018-06-01 DIAGNOSIS — L0201 Cutaneous abscess of face: Secondary | ICD-10-CM

## 2018-06-01 LAB — BASIC METABOLIC PANEL
Anion gap: 4 — ABNORMAL LOW (ref 5–15)
BUN: 17 mg/dL (ref 6–20)
CHLORIDE: 104 mmol/L (ref 98–111)
CO2: 30 mmol/L (ref 22–32)
CREATININE: 1 mg/dL (ref 0.61–1.24)
Calcium: 8.7 mg/dL — ABNORMAL LOW (ref 8.9–10.3)
GFR calc non Af Amer: >60 mL/min (ref >60)
Glucose, Bld: 113 mg/dL — ABNORMAL HIGH (ref 70–99)
POTASSIUM: 4.1 mmol/L (ref 3.5–5.1)
SODIUM: 138 mmol/L (ref 135–145)

## 2018-06-01 LAB — CBC WITH DIFFERENTIAL/PLATELET
Basophils Absolute: 0.1 10*3/uL (ref 0–0.1)
Basophils Relative: 1 %
EOS ABS: 0.3 10*3/uL (ref 0–0.7)
Eosinophils Relative: 3 %
HEMATOCRIT: 41.9 % (ref 40.0–52.0)
HEMOGLOBIN: 14.4 g/dL (ref 13.0–18.0)
LYMPHS ABS: 1.5 10*3/uL (ref 1.0–3.6)
Lymphocytes Relative: 15 %
MCH: 31.7 pg (ref 26.0–34.0)
MCHC: 34.3 g/dL (ref 32.0–36.0)
MCV: 92.5 fL (ref 80.0–100.0)
MONOS PCT: 11 %
Monocytes Absolute: 1.2 10*3/uL — ABNORMAL HIGH (ref 0.2–1.0)
NEUTROS ABS: 7.2 10*3/uL — AB (ref 1.4–6.5)
NEUTROS PCT: 70 %
Platelets: 199 10*3/uL (ref 150–440)
RBC: 4.53 MIL/uL (ref 4.40–5.90)
RDW: 14.4 % (ref 11.5–14.5)
WBC: 10.2 10*3/uL (ref 3.8–10.6)

## 2018-06-01 MED ORDER — CLINDAMYCIN HCL 150 MG PO CAPS: ORAL_CAPSULE | ORAL | 0 refills | Status: AC

## 2018-06-01 MED ORDER — OXYCODONE-ACETAMINOPHEN 5-325 MG PO TABS
1.0000 | ORAL_TABLET | Freq: Once | ORAL | Status: AC
  Administered 2018-06-01: 1 via ORAL
  Filled 2018-06-01: qty 1

## 2018-06-01 MED ORDER — CLINDAMYCIN PHOSPHATE 600 MG/50ML IV SOLN
600.0000 mg | Freq: Once | INTRAVENOUS | Status: AC
  Administered 2018-06-01: 600 mg via INTRAVENOUS
  Filled 2018-06-01: qty 50

## 2018-06-01 MED ORDER — OXYCODONE-ACETAMINOPHEN 5-325 MG PO TABS
1.0000 | ORAL_TABLET | Freq: Four times a day (QID) | ORAL | 0 refills | Status: AC | PRN
Start: 2018-06-01 — End: ?

## 2018-06-01 NOTE — ED Provider Notes (Signed)
[facility] Emergency Department Provider Note   ____________________________________________   First MD Initiated Contact with Patient 06/01/18 1133     (approximate)  I have reviewed the triage vital signs and the nursing notes.   HISTORY  Chief Complaint Abscess   HPI William Gonzales is a 42 y.o. male is here with complaint of an abscess to the right side of his face.  Patient states that he was trying to avoid coming to the ED.  He states he stuck a needle in the area and also his pocket knife in order to drain the area.  He states very little came out.  He is unaware of any fever or chills and denies nausea or vomiting.  Patient is uncertain when he had his last tetanus booster.  He denies any previous abscesses.  He rates his pain as 9/10.  Past Medical History:  Diagnosis Date  . Depression   . GERD (gastroesophageal reflux disease)     Patient Active Problem List   Diagnosis Date Noted  . Severe recurrent major depressive disorder with psychotic features (HCC) 01/27/2017  . Amphetamine and psychostimulant-induced psychosis with delusions (HCC) 01/26/2017  . Amphetamine use disorder, moderate (HCC) 01/26/2017  . Opioid use disorder, moderate, dependence (HCC) 01/26/2017  . GERD (gastroesophageal reflux disease) 01/26/2017  . Tobacco use disorder 01/26/2017    History reviewed. No pertinent surgical history.  Prior to Admission medications   Medication Sig Start Date End Date Taking? Authorizing Provider  omeprazole (PRILOSEC) 40 MG capsule Take 40 mg by mouth daily.   Yes [provider]  clindamycin (CLEOCIN) 150 MG capsule Take 2 capsules every 6 hours for infection 06/01/18   Tommi RumpsSummers, Olivene Cookston L, PA-C  oxyCODONE-acetaminophen (PERCOCET) 5-325 MG tablet Take 1 tablet by mouth every 6 (six) hours as needed for severe pain. 06/01/18   Tommi RumpsSummers, Hanan Mcwilliams L, PA-C  risperiDONE (RISPERDAL) 1 MG tablet Take 1 tablet (1 mg total) by mouth 2 (two)  times daily. 01/28/17   Pucilowska, Braulio ConteJolanta B, MD  traZODone (DESYREL) 100 MG tablet Take 1 tablet (100 mg total) by mouth at bedtime. 01/28/17   Pucilowska, Ellin GoodieJolanta B, MD    Allergies Patient has no known allergies.  No family history on file.  Social History Social History   Tobacco Use  . Smoking status: Current Every Day Smoker    Packs/day: 1.50  . Smokeless tobacco: Current User  Substance Use Topics  . Alcohol use: Yes  . Drug use: No    Review of Systems Constitutional: No fever/chills Eyes: No visual changes. ENT: No sore throat.  Negative for ear pain. Cardiovascular: Denies chest pain. Respiratory: Denies shortness of breath. Musculoskeletal: Negative for back pain. Skin: Positive for abscess. Neurological: Negative for headaches, focal weakness or numbness. ___________________________________________   PHYSICAL EXAM:  VITAL SIGNS: ED Triage Vitals  Enc Vitals Group     BP 06/01/18 1101 (!) 140/93     Pulse Rate 06/01/18 1101 (!) 105     Resp 06/01/18 1101 18     Temp 06/01/18 1101 98.7 F (37.1 C)     Temp Source 06/01/18 1101 Oral     SpO2 06/01/18 1101 98 %     Weight 06/01/18 1101 185 lb (83.9 kg)     Height 06/01/18 1101 5\' 9"  (1.753 m)     Head Circumference --      Peak Flow --      Pain Score 06/01/18 1113 9     Pain  Loc --      Pain Edu? --      Excl. in GC? --     Constitutional: Alert and oriented. Well appearing and in no acute distress. Eyes: Conjunctivae are normal. PERRL. EOMI. Head: Atraumatic. Nose: No congestion/rhinnorhea. Mouth/Throat: Mucous membranes are moist.  Oropharynx non-erythematous. Neck: No stridor.   Hematological/Lymphatic/Immunilogical: Mild right cervical lymphadenopathy. Cardiovascular: Normal rate, regular rhythm. Grossly normal heart sounds.  Good peripheral circulation. Respiratory: Normal respiratory effort.  No retractions. Lungs CTAB. Musculoskeletal: No lower extremity tenderness nor edema.  No joint  effusions. Neurologic:  Normal speech and language. No gross focal neurologic deficits are appreciated. No gait instability. Skin:  Skin is warm, dry.  Right lateral base is erythematous and warm.  There is a small area preauricular area that is firm and moderately tender.  There is a puncture wound resulting from patient self in flexion to attempt draining the area with his pocket knife.  No active drainage or bleeding is present at this time. Psychiatric: Mood and affect are normal. Speech and behavior are normal.  ____________________________________________   LABS (all labs ordered are listed, but only abnormal results are displayed)  Labs Reviewed  CBC WITH DIFFERENTIAL/PLATELET - Abnormal; Notable for the following components:      Result Value   Neutro Abs 7.2 (*)    Monocytes Absolute 1.2 (*)    All other components within normal limits  BASIC METABOLIC PANEL - Abnormal; Notable for the following components:   Glucose, Bld 113 (*)    Calcium 8.7 (*)    Anion gap 4 (*)    All other components within normal limits    PROCEDURES  Procedure(s) performed: None  Procedures  Critical Care performed: No  ____________________________________________   INITIAL IMPRESSION / ASSESSMENT AND PLAN / ED COURSE  As part of my medical decision making, I reviewed the following data within the electronic MEDICAL RECORD NUMBER Notes from prior ED visits and [place] Controlled Substance Database  Patient was made aware that he has an abscess to the right side of his face.  He is encouraged not to try to open this area with his pocket knife again.  He was given IV clindamycin along with Percocet 5/325 p.o. while in the emergency department.  Instructions were given for him to continue with the antibiotic and warm compresses to his face frequently.  He will return to the emergency department in 1 to 2 days at which time hopefully we will be able to I&D this  area.   ____________________________________________   FINAL CLINICAL IMPRESSION(S) / ED DIAGNOSES  Final diagnoses:  Acute abscess of face  Cellulitis, face     ED Discharge Orders        Ordered    oxyCODONE-acetaminophen (PERCOCET) 5-325 MG tablet  Every 6 hours PRN     06/01/18 1330    clindamycin (CLEOCIN) 150 MG capsule     06/01/18 1330       Note:  This document was prepared using Dragon voice recognition software and may include unintentional dictation errors.    Tommi Rumps, PA-C 06/01/18 1619    Dionne Bucy, MD 06/02/18 [phone]

## 2018-06-01 NOTE — Discharge Instructions (Signed)
Begin taking antibiotics as directed until completely finished.  Percocet 1 every 6 hours as needed for moderate to severe pain.  Use warm moist compresses to the area frequently.  Return to the emergency department if any worsening of your symptoms such as high fever or vomiting.  Return to the ED in approximately 2 days for drainage of this area.  Do not stab the area with any objects from home.

## 2018-06-01 NOTE — ED Notes (Signed)
See triage note  Presents with possible abscess area to right side of face beside ear    Area is very swollen and tender

## 2018-06-01 NOTE — ED Triage Notes (Signed)
Pt comes into the ED via POV c/o abscess to the right side of the face.  The patient attempted to open it up today and now there is swelling and redness around the area.  Patient states that the abscess appeared 4 days ago.  Denies any fevers at this time.  Patient in NAD with even and unlabored respirations.

## 2019-02-01 ENCOUNTER — Emergency Department
Admission: EM | Admit: 2019-02-01 | Discharge: 2019-02-02 | Disposition: A | Discharge status: Home / Self Care | Payer: Self-pay | Attending: Emergency Medicine | Admitting: Emergency Medicine

## 2019-02-01 ENCOUNTER — Other Ambulatory Visit: Payer: Self-pay

## 2019-02-01 ENCOUNTER — Encounter: Payer: Self-pay | Admitting: Emergency Medicine

## 2019-02-01 DIAGNOSIS — T401X1A Poisoning by heroin, accidental (unintentional), initial encounter: Secondary | ICD-10-CM | POA: Insufficient documentation

## 2019-02-01 DIAGNOSIS — Z79899 Other long term (current) drug therapy: Secondary | ICD-10-CM | POA: Insufficient documentation

## 2019-02-01 MED ORDER — NALOXONE HCL 0.4 MG/ML IJ SOLN
0.2500 mg | Freq: Once | INTRAMUSCULAR | Status: AC
  Administered 2019-02-01: 0.25 mg via INTRAVENOUS

## 2019-02-01 MED ORDER — NALOXONE HCL 4 MG/0.1ML NA LIQD: NASAL | Status: DC

## 2019-02-01 NOTE — ED Provider Notes (Signed)
[facility] Emergency Department Provider Note    First MD Initiated Contact with Patient 02/01/19 2106     (approximate)  I have reviewed the triage vital signs and the nursing notes.   HISTORY  Chief Complaint Drug Overdose    HPI ENOCK ANASTASIOU is a 43 y.o. male below listed past medical history presents after recreational overdose on heroin.  Patient reportedly found by first responders was given 2 doses of intranasal Narcan with improvement.  Brought to the ER for further evaluation.  No report of self-harm.  Does endorse history of drug abuse.  Past Medical History:  Diagnosis Date  . Depression   . GERD (gastroesophageal reflux disease)    History reviewed. No pertinent family history. History reviewed. No pertinent surgical history. Patient Active Problem List   Diagnosis Date Noted  . Severe recurrent major depressive disorder with psychotic features (HCC) 01/27/2017  . Amphetamine and psychostimulant-induced psychosis with delusions (HCC) 01/26/2017  . Amphetamine use disorder, moderate (HCC) 01/26/2017  . Opioid use disorder, moderate, dependence (HCC) 01/26/2017  . GERD (gastroesophageal reflux disease) 01/26/2017  . Tobacco use disorder 01/26/2017      Prior to Admission medications   Medication Sig Start Date End Date Taking? Authorizing Provider  clindamycin (CLEOCIN) 150 MG capsule Take 2 capsules every 6 hours for infection 06/01/18   Bridget Hartshorn L, PA-C  omeprazole (PRILOSEC) 40 MG capsule Take 40 mg by mouth daily.    [provider]  oxyCODONE-acetaminophen (PERCOCET) 5-325 MG tablet Take 1 tablet by mouth every 6 (six) hours as needed for severe pain. 06/01/18   Tommi Rumps, PA-C  risperiDONE (RISPERDAL) 1 MG tablet Take 1 tablet (1 mg total) by mouth 2 (two) times daily. 01/28/17   Pucilowska, Braulio Conte B, MD  traZODone (DESYREL) 100 MG tablet Take 1 tablet (100 mg total) by mouth at bedtime. 01/28/17   Pucilowska,  Ellin Goodie, MD    Allergies Patient has no known allergies.    Social History Social History   Tobacco Use  . Smoking status: Current Every Day Smoker    Packs/day: 1.50  . Smokeless tobacco: Current User  Substance Use Topics  . Alcohol use: Yes  . Drug use: Yes    Review of Systems Patient denies headaches, rhinorrhea, blurry vision, numbness, shortness of breath, chest pain, edema, cough, abdominal pain, nausea, vomiting, diarrhea, dysuria, fevers, rashes or hallucinations unless otherwise stated above in HPI. ____________________________________________   PHYSICAL EXAM:  VITAL SIGNS: Vitals:   02/01/19 2238 02/01/19 2300  BP:  112/87  Pulse: 99 100  Resp: 15 15  Temp:    SpO2: 95% 93%    Constitutional: Alert and oriented.  Drowsy but protecting his airway Eyes: Conjunctivae are normal.   Pupils pinpoint Head: Atraumatic. Nose: No congestion/rhinnorhea. Mouth/Throat: Mucous membranes are moist.   Neck: Painless ROM.  Cardiovascular:   Good peripheral circulation. Respiratory: Normal respiratory effort.  No retractions.  Gastrointestinal: Soft and nontender.  Musculoskeletal: No lower extremity tenderness .  No joint effusions. Neurologic:  Normal speech and language. No gross focal neurologic deficits are appreciated.  Skin:  Skin is warm, dry and intact. No rash noted. Psychiatric: Mood and affect are normal. ____________________________________________   LABS (all labs ordered are listed, but only abnormal results are displayed)  No results found for this or any previous visit (from the past 24 hour(s)). ____________________________________________ ____________________________________________   PROCEDURES  Procedure(s) performed:  Procedures    Critical Care performed: no  ____________________________________________   INITIAL IMPRESSION / ASSESSMENT AND PLAN / ED COURSE  Pertinent labs & imaging results that were available during my care  of the patient were reviewed by me and considered in my medical decision making (see chart for details).  DDX: overdose, substance abuse, seizure  HITESH CLARKIN is a 43 y.o. who presents to the ED with suspected heroin overdose.   Clinical Course as of Jan 31 2301  Thu Feb 01, 2019  2106 Patient much more alert after Narcan.  We will continue to observe.  Patient does endorse doing heroin today.   [PR]  2300 Patient sleeping currently respirations are normal and he is satting well on room air.  As he is fairly drowsy right now we will let him metabolize and observe in the ER.  Once he is more alert he will be stable and appropriate for discharge.   [PR]    Clinical Course User Index [PR] Willy Eddy, MD     ____________________________________________   FINAL CLINICAL IMPRESSION(S) / ED DIAGNOSES  Final diagnoses:  Accidental overdose of heroin, initial encounter [facility])      NEW MEDICATIONS STARTED DURING THIS VISIT:  New Prescriptions   No medications on file     Note:  This document was prepared using Dragon voice recognition software and may include unintentional dictation errors.     Willy Eddy, MD 02/01/19 [phone]
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
## 2019-02-02 MED ORDER — NALOXONE HCL 4 MG/0.1ML NA LIQD: NASAL | Status: AC

## 2019-02-02 NOTE — ED Provider Notes (Signed)
-----------------------------------------   6:23 AM on 02/02/2019 -----------------------------------------   Assumed care from Dr. Roxan Hockey.  In short, William Gonzales is a 44 y.o. male with a chief complaint of opioid overdose.  Refer to the original H&P for additional details.  The patientis now awake and alert, ambulating without difficulty, ready for discharge.  Gave discharge instructions prepared by Dr. Roxan Hockey and gave a prescription for Narcan.   Loleta Rose, MD 02/02/19 [phone]

## 2019-02-02 NOTE — ED Notes (Signed)
Patient awake, alert, and oriented X 4. Patient able to ambulate with steady gait.This RN reviewed discharge instructions, follow-up care, and prescription with patient. Patient verbalized understanding of all information reviewed.  Patient stable, with no distress noted at this time.

## 2019-03-29 ENCOUNTER — Other Ambulatory Visit: Payer: Self-pay

## 2019-03-29 ENCOUNTER — Emergency Department
Admission: EM | Admit: 2019-03-29 | Discharge: 2019-03-30 | Disposition: A | Discharge status: Home / Self Care | Payer: Self-pay | Attending: Emergency Medicine | Admitting: Emergency Medicine

## 2019-03-29 ENCOUNTER — Encounter: Payer: Self-pay | Admitting: *Deleted

## 2019-03-29 ENCOUNTER — Emergency Department: Payer: Self-pay

## 2019-03-29 DIAGNOSIS — F1110 Opioid abuse, uncomplicated: Secondary | ICD-10-CM | POA: Insufficient documentation

## 2019-03-29 DIAGNOSIS — F325 Major depressive disorder, single episode, in full remission: Secondary | ICD-10-CM | POA: Insufficient documentation

## 2019-03-29 DIAGNOSIS — F17210 Nicotine dependence, cigarettes, uncomplicated: Secondary | ICD-10-CM | POA: Insufficient documentation

## 2019-03-29 DIAGNOSIS — F602 Antisocial personality disorder: Secondary | ICD-10-CM | POA: Insufficient documentation

## 2019-03-29 DIAGNOSIS — T401X1A Poisoning by heroin, accidental (unintentional), initial encounter: Secondary | ICD-10-CM | POA: Insufficient documentation

## 2019-03-29 DIAGNOSIS — F329 Major depressive disorder, single episode, unspecified: Secondary | ICD-10-CM | POA: Insufficient documentation

## 2019-03-29 HISTORY — DX: Other psychoactive substance abuse, uncomplicated: F19.10

## 2019-03-29 LAB — CBC WITH DIFFERENTIAL/PLATELET
Abs Immature Granulocytes: 0.02 10*3/uL (ref 0.00–0.07)
Basophils Absolute: 0 10*3/uL (ref 0.0–0.1)
Basophils Relative: 1 %
Eosinophils Absolute: 0.2 10*3/uL (ref 0.0–0.5)
Eosinophils Relative: 3 %
HCT: 46.1 % (ref 39.0–52.0)
Hemoglobin: 15.3 g/dL (ref 13.0–17.0)
Immature Granulocytes: 0 %
Lymphocytes Relative: 22 %
Lymphs Abs: 1.2 10*3/uL (ref 0.7–4.0)
MCH: 30.5 pg (ref 26.0–34.0)
MCHC: 33.2 g/dL (ref 30.0–36.0)
MCV: 91.8 fL (ref 80.0–100.0)
Monocytes Absolute: 0.5 10*3/uL (ref 0.1–1.0)
Monocytes Relative: 9 %
Neutro Abs: 3.7 10*3/uL (ref 1.7–7.7)
Neutrophils Relative %: 65 %
Platelets: 203 10*3/uL (ref 150–400)
RBC: 5.02 MIL/uL (ref 4.22–5.81)
RDW: 13.2 % (ref 11.5–15.5)
WBC: 5.6 10*3/uL (ref 4.0–10.5)
nRBC: 0 % (ref 0.0–0.2)

## 2019-03-29 LAB — COMPREHENSIVE METABOLIC PANEL
ALT: 128 U/L — ABNORMAL HIGH (ref 0–44)
AST: 83 U/L — ABNORMAL HIGH (ref 15–41)
Albumin: 3.8 g/dL (ref 3.5–5.0)
Alkaline Phosphatase: 78 U/L (ref 38–126)
Anion gap: 7 (ref 5–15)
BUN: 17 mg/dL (ref 6–20)
CO2: 29 mmol/L (ref 22–32)
Calcium: 8.9 mg/dL (ref 8.9–10.3)
Chloride: 99 mmol/L (ref 98–111)
Creatinine, Ser: 0.87 mg/dL (ref 0.61–1.24)
GFR calc Af Amer: >60 mL/min (ref >60)
GFR calc non Af Amer: >60 mL/min (ref >60)
Glucose, Bld: 118 mg/dL — ABNORMAL HIGH (ref 70–99)
Potassium: 4.2 mmol/L (ref 3.5–5.1)
Sodium: 135 mmol/L (ref 135–145)
Total Bilirubin: 1.8 mg/dL — ABNORMAL HIGH (ref 0.3–1.2)
Total Protein: 7.2 g/dL (ref 6.5–8.1)

## 2019-03-29 LAB — ETHANOL: Alcohol, Ethyl (B): <10 mg/dL (ref <10)

## 2019-03-29 MED ORDER — IBUPROFEN 600 MG PO TABS
600.0000 mg | ORAL_TABLET | Freq: Once | ORAL | Status: AC
  Administered 2019-03-29: 600 mg via ORAL
  Filled 2019-03-29: qty 1

## 2019-03-29 NOTE — ED Notes (Signed)
Pt leaving for CT.  

## 2019-03-29 NOTE — ED Notes (Addendum)
Pt denies thoughts to or desire to harm self. States OD was accidental. Pt irritated but cooperating. Given warm blankets.
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
## 2019-03-29 NOTE — ED Notes (Signed)
Attempted butterfly stick for labs. Would draw back but not enough for tubes. Pt's arms full of scarred tissue. Will have 2nd RN try.

## 2019-03-29 NOTE — ED Notes (Signed)
Pt back from CT

## 2019-03-29 NOTE — ED Notes (Signed)
Pt. Transferred from room 2 to room 22 after dressing out and screening for contraband. Pt. Oriented to Quad including Q15 minute rounds as well as Psychologist, counselling for their protection. Patient is alert and oriented, warm and dry in no acute distress. Patient denies SI, HI, and AVH. States he doesn't understand why he's here since it was a mistake that he took too much heroine. Pt. Encouraged to let me know if needs arise.

## 2019-03-29 NOTE — ED Provider Notes (Signed)
[facility] Emergency Department Provider Note  ____________________________________________  Time seen: Approximately 10:15 PM  I have reviewed the triage vital signs and the nursing notes.   HISTORY  Chief Complaint Drug Overdose   HPI William Gonzales is a 43 y.o. male with a history of amphetamine and heroin abuse who presents for evaluation of an accidental overdose.  Patient is brought in by EMS IVC by police for overdose of heroin.  Police and EMS responded to his apartment where patient had overdose of heroin.  Patient was accompanied by his girlfriend and a 71-year-old child.  Girlfriend had given patient Narcan prior to EMS arrival.  Patient was sleepy but arousable, breathing and satting normally per EMS.  Patient denies SI or HI.  Reports history of heroin abuse, reports using it daily.  Patient reports it was an accidental overdose.  Patient was found down.  Unknown if patient collapsed.  Patient is complaining of severe headache that has been present since Narcan was given.  The headache is generalized and sharp.  He denies neck pain or back pain.  Past Medical History:  Diagnosis Date  . Depression   . GERD (gastroesophageal reflux disease)   . Polysubstance abuse [facility])     Patient Active Problem List   Diagnosis Date Noted  . Severe recurrent major depressive disorder with psychotic features (HCC) 01/27/2017  . Amphetamine and psychostimulant-induced psychosis with delusions (HCC) 01/26/2017  . Amphetamine use disorder, moderate (HCC) 01/26/2017  . Opioid use disorder, moderate, dependence (HCC) 01/26/2017  . GERD (gastroesophageal reflux disease) 01/26/2017  . Tobacco use disorder 01/26/2017    History reviewed. No pertinent surgical history.  Prior to Admission medications   Medication Sig Start Date End Date Taking? Authorizing Provider  clindamycin (CLEOCIN) 150 MG capsule Take 2 capsules every 6 hours for infection 06/01/18   Tommi Rumps, PA-C  naloxone [facility]) nasal spray 4 mg/0.1 mL Use in the event of suspected overdose of heroin or opiates 02/02/19   Loleta Rose, MD  omeprazole (PRILOSEC) 40 MG capsule Take 40 mg by mouth daily.    [provider]  oxyCODONE-acetaminophen (PERCOCET) 5-325 MG tablet Take 1 tablet by mouth every 6 (six) hours as needed for severe pain. 06/01/18   Tommi Rumps, PA-C  risperiDONE (RISPERDAL) 1 MG tablet Take 1 tablet (1 mg total) by mouth 2 (two) times daily. 01/28/17   Pucilowska, Braulio Conte B, MD  traZODone (DESYREL) 100 MG tablet Take 1 tablet (100 mg total) by mouth at bedtime. 01/28/17   Pucilowska, Ellin Goodie, MD    Allergies Patient has no known allergies.  History reviewed. No pertinent family history.  Social History Social History   Tobacco Use  . Smoking status: Current Every Day Smoker    Packs/day: 1.50    Types: Cigarettes  . Smokeless tobacco: Current User  Substance Use Topics  . Alcohol use: Yes  . Drug use: Yes    Types: IV, Methamphetamines    Comment: Heroin    Review of Systems  Constitutional: Negative for fever. Eyes: Negative for visual changes. ENT: Negative for sore throat. Neck: No neck pain  Cardiovascular: Negative for chest pain. Respiratory: Negative for shortness of breath. Gastrointestinal: Negative for abdominal pain, vomiting or diarrhea. Genitourinary: Negative for dysuria. Musculoskeletal: Negative for back pain. Skin: Negative for rash. Neurological: Negative for weakness or numbness. + HA Psych: No SI or HI  ____________________________________________   PHYSICAL EXAM:  VITAL SIGNS: ED Triage Vitals  Enc Vitals Group     BP 03/29/19 2205 (!) 150/106     Pulse Rate 03/29/19 2205 89     Resp 03/29/19 2205 12     Temp 03/29/19 2205 98.2 F (36.8 C)     Temp Source 03/29/19 2205 Oral     SpO2 03/29/19 2205 98 %     Weight 03/29/19 2210 190 lb (86.2 kg)     Height 03/29/19 2210  (1.753 m)     Head  Circumference --      Peak Flow --      Pain Score 03/29/19 2209 6     Pain Loc --      Pain Edu? --      Excl. in GC? --     Constitutional: Alert and oriented, no apparent distress. HEENT:      Head: Normocephalic and atraumatic.         Eyes: Conjunctivae are normal. Sclera is non-icteric. Pupils 2mm and reactive      Mouth/Throat: Mucous membranes are moist.       Neck: Supple with no signs of meningismus. No cspine tenderness Cardiovascular: Regular rate and rhythm. No murmurs, gallops, or rubs. 2+ symmetrical distal pulses are present in all extremities. No JVD. Respiratory: Normal respiratory effort. Lungs are clear to auscultation bilaterally. No wheezes, crackles, or rhonchi.  Gastrointestinal: Soft, non tender, and non distended with positive bowel sounds. No rebound or guarding. Musculoskeletal: Several track marks. No edema, cyanosis, or erythema of extremities. Neurologic: Normal speech and language. Face is symmetric. Moving all extremities. No gross focal neurologic deficits are appreciated. Skin: Skin is warm, dry and intact. No rash noted. Psychiatric: Mood and affect are normal. Speech and behavior are normal.  ____________________________________________   LABS (all labs ordered are listed, but only abnormal results are displayed)  Labs Reviewed  CBC WITH DIFFERENTIAL/PLATELET  COMPREHENSIVE METABOLIC PANEL  URINE DRUG SCREEN, QUALITATIVE ([facility] ONLY)  ETHANOL   ____________________________________________  EKG  ED ECG REPORT I, Nita Sickle, the attending physician, personally viewed and interpreted this ECG.  Normal sinus rhythm, rate of 88, normal intervals, normal axis, no ST elevations or depressions.  Normal EKG. ____________________________________________  RADIOLOGY  I have personally reviewed the images performed during this visit and I agree with the Radiologist's read.   Interpretation by Radiologist:  Ct Head Wo Contrast  Result  Date: 03/29/2019 CLINICAL DATA:  Heroin use tonight, Narcan administered by unknown male at scene prior to ambulance arrival, headache, somnolence, history smoking, polysubstance abuse EXAM: CT HEAD WITHOUT CONTRAST TECHNIQUE: Contiguous axial images were obtained from the base of the skull through the vertex without intravenous contrast. Sagittal and coronal MPR images reconstructed from axial data set. COMPARISON:  03/15/2011 FINDINGS: Brain: Normal ventricular morphology. No midline shift or mass effect. Normal appearance of brain parenchyma. No intracranial hemorrhage, mass lesion, evidence of acute infarction, or extra-axial fluid collection. Vascular: No hyperdense vessels Skull: Intact Sinuses/Orbits: Minimal mucosal thickening within a few anterior ethmoid air cells Other: N/A IMPRESSION: No acute intracranial abnormalities. Electronically Signed   By: Ulyses Southward M.D.   On: 03/29/2019 22:28     ____________________________________________   PROCEDURES  Procedure(s) performed: None Procedures Critical Care performed:  None ____________________________________________   INITIAL IMPRESSION / ASSESSMENT AND PLAN / ED COURSE  43 y.o. male with a history of amphetamine and heroin abuse who presents for evaluation of an accidental overdose.  Patient received Narcan before EMS arrived.  No respiratory cardiac arrest.  Patient  complaining of diffuse severe headache.  Unknown if patient collapsed and sustain any trauma.  There is no trauma on exam.  No CT and L-spine tenderness.  Head CT is negative for acute bleed.  Patient was IVC by police.  Therefore will do basic labs for medical clearance and consult psychiatry.  Patient reports that his overdose was accidental and denies any suicidality.      As part of my medical decision making, I reviewed the following data within the electronic MEDICAL RECORD NUMBER Nursing notes reviewed and incorporated, Old chart reviewed, Radiograph reviewed , A  consult was requested and obtained from this/these consultant(s) Psychiatry, Notes from prior ED visits and [place] Controlled Substance Database    Pertinent labs & imaging results that were available during my care of the patient were reviewed by me and considered in my medical decision making (see chart for details).    ____________________________________________   FINAL CLINICAL IMPRESSION(S) / ED DIAGNOSES  Final diagnoses:  Accidental overdose of heroin, initial encounter (HCC)      NEW MEDICATIONS STARTED DURING THIS VISIT:  ED Discharge Orders    None       Note:  This document was prepared using Dragon voice recognition software and may include unintentional dictation errors.    Nita SickleVeronese, [place], MD 03/29/19 [phone]

## 2019-03-29 NOTE — ED Notes (Signed)
VSS. Pt dressed out in wine scrubs. Belongings in pt bag with label attached. Pt displeased but cooperating.

## 2019-03-29 NOTE — ED Notes (Signed)
Pt states he needs to urinate. Given urinal but then pt refused to use it.

## 2019-03-29 NOTE — ED Notes (Signed)
Erie Noe, RN straight stick now. Grn/red/lav tubes sent to lab.

## 2019-03-30 LAB — URINE DRUG SCREEN, QUALITATIVE (ARMC ONLY)
Amphetamines, Ur Screen: POSITIVE — AB
Barbiturates, Ur Screen: NOT DETECTED
Benzodiazepine, Ur Scrn: NOT DETECTED
Cannabinoid 50 Ng, Ur ~~LOC~~: POSITIVE — AB
Cocaine Metabolite,Ur ~~LOC~~: NOT DETECTED
MDMA (Ecstasy)Ur Screen: NOT DETECTED
Methadone Scn, Ur: NOT DETECTED
Opiate, Ur Screen: POSITIVE — AB
Phencyclidine (PCP) Ur S: NOT DETECTED
Tricyclic, Ur Screen: NOT DETECTED

## 2019-03-30 NOTE — ED Notes (Signed)
Patient arrested by Jules Husbands and taken to [facility].

## 2019-03-30 NOTE — ED Notes (Signed)
Pt. Talking to SOC Psychiatrist. 

## 2019-03-30 NOTE — ED Notes (Signed)
Pt. Ambulated from his room to ambulance doors and back to room without difficulty. Dr. Dolores Frame made aware.

## 2019-03-30 NOTE — ED Notes (Signed)
Hourly rounding reveals patient sleeping in room. No complaints, stable, in no acute distress. Q15 minute rounds and monitoring via Rover and Officer to continue.  

## 2019-03-30 NOTE — ED Provider Notes (Signed)
-----------------------------------------   2:14 AM on 03/30/2019 -----------------------------------------  Patient was evaluated by [facility] psychiatrist Dr. Jacky Kindle who deems him psychiatrically stable for discharge home with outpatient mental health follow-up.  Will rescind patient's IVC.  Patient ambulated with steady gait up and down the hallways.  Strict return precautions given.  Patient verbalizes understanding agrees with plan of care.   Irean Hong, MD 03/30/19 [phone]

## 2019-03-30 NOTE — Discharge Instructions (Addendum)
Stop using illicit drugs.  Return to the ER for worsening symptoms, feelings of hurting yourself or others, or other concerns. 

## 2019-03-30 NOTE — ED Notes (Signed)
Hourly rounding reveals patient in room. No complaints, stable, in no acute distress. Q15 minute rounds and monitoring via Rover and Officer to continue.
